# Patient Record
Sex: Male | Born: 1995 | Race: Black or African American | Hispanic: No | Marital: Single | Smoking: Current every day smoker
Health system: Southern US, Community
[De-identification: ages and names within clinical notes are randomized; demographics above are authoritative.]

## PROBLEM LIST (undated history)

## (undated) DIAGNOSIS — K311 Adult hypertrophic pyloric stenosis: Secondary | ICD-10-CM

## (undated) HISTORY — DX: Adult hypertrophic pyloric stenosis: K31.1

## (undated) HISTORY — PX: SMALL INTESTINE SURGERY: SHX150

## (undated) HISTORY — PX: KNEE SURGERY: SHX244

---
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
## 2013-01-06 ENCOUNTER — Telehealth: Payer: Self-pay | Admitting: Family Medicine

## 2013-01-06 NOTE — Telephone Encounter (Signed)
Attempted to call mother to inform that pt is in need of multiple shots per [facility] record. Record left up front for pick up. Wyatt Haste, RN-BSN

## 2013-01-06 NOTE — Telephone Encounter (Signed)
Mother is requesting a copy of Jose Gray's shot records left up front. JW

## 2013-01-08 ENCOUNTER — Telehealth: Payer: Self-pay | Admitting: Family Medicine

## 2013-01-08 NOTE — Telephone Encounter (Signed)
Pt's mother dropped off paperwork to be filled out concerning school.

## 2013-01-08 NOTE — Telephone Encounter (Signed)
I do not see where we have ever done a WCC on Louie.  He will need an [facility] appointment before forms for college can be completed.  Forms given back to [place].  She will call mom and schedule Akai an appointment.  Ileana Ladd

## 2013-02-21 ENCOUNTER — Ambulatory Visit (INDEPENDENT_AMBULATORY_CARE_PROVIDER_SITE_OTHER): Payer: Medicaid Other | Admitting: Family Medicine

## 2013-02-21 ENCOUNTER — Encounter: Payer: Self-pay | Admitting: Family Medicine

## 2013-02-21 VITALS — BP 139/74 | HR 56 | Temp 98.2°F | Ht 70.5 in | Wt 194.0 lb

## 2013-02-21 DIAGNOSIS — Z0000 Encounter for general adult medical examination without abnormal findings: Secondary | ICD-10-CM | POA: Insufficient documentation

## 2013-02-21 DIAGNOSIS — Z23 Encounter for immunization: Secondary | ICD-10-CM

## 2013-02-21 DIAGNOSIS — Z00129 Encounter for routine child health examination without abnormal findings: Secondary | ICD-10-CM

## 2013-02-21 DIAGNOSIS — Z7251 High risk heterosexual behavior: Secondary | ICD-10-CM | POA: Insufficient documentation

## 2013-02-21 NOTE — Assessment & Plan Note (Signed)
Patient reports approximately 15 lifetime partners. He does report condom use with each sexual encounter. He is currently only sexually active with females. Patient reports that he has upcoming appointment with on-campus clinic for STD check.

## 2013-02-21 NOTE — Assessment & Plan Note (Addendum)
17 year old male presents for annual physical exam. Immunizations were provided the patient is currently up-to-date. He does not qualify for any cancer screening as he is 17 years of age. He is an appropriate weight. He exercises regularly. Patient was counseled to return in one year for annual visit. Blood pressure is mildly elevated today and will need recheck at future office visits.

## 2013-02-21 NOTE — Patient Instructions (Signed)
Return of office in 1 year for yearly physical/well check.  Please keep your appointment with the clinic at your school.  If you ever would like a prescription to help you quit smoking please let up know.

## 2013-02-21 NOTE — Progress Notes (Addendum)
  Subjective:    Patient ID: Jose Gray, male    DOB: [birth year], 17 y.o.   MRN: [id number]  HPI 17 year old male presents for annual well visit.  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, states that classes are going well  Exercise-patient currently lives 4-5 times per week, does weights regularly, is planning to go for the football team 67  Diet-patient eats a well balanced diet  Sexual health-patient is sexually active with women, he has had approximately 15 sexual partners in his lifetime,he reports condom use with each episode of intercourse, he has an appointment scheduled with his [facility] clinic for a STD check next week, he does not report any recent penile pain or discharge, denies dysuria, denies fevers,  No chills, no abdominal pain  Tobacco-patient states that he smokes proximally to 3 black and mild per day, he is done this for the past year  Drug use-patient denies illicit drug use  Alcohol-patient denies alcohol use   Patient is without acute complaint today.  Reviewed past medical/social/family history.  Review of Systems  Constitutional: Negative for fever, chills and fatigue.  HENT: Negative for sore throat.   Respiratory: Negative for apnea, cough and choking.   Cardiovascular: Negative for chest pain.  Gastrointestinal: Negative for nausea, vomiting and diarrhea.  Genitourinary: Negative for dysuria and genital sores.       Objective:   Physical Exam Vitals: Reviewed General: Pleasant African American male, no acute distress HEENT: Normocephalic, pupils are equal round and reactive to light, extraocular movements are intact, no scleral icterus, nasal septum midline, moist mucous membranes, uvula midline, no pharyngeal erythema or exudate noted, neck was supple, no anterior  posterior cervical lymphadenopathy, bilateral TMs are pearly-gray without evidence of erythema or bulging, no thyromegaly Cardiac: Regular rate and rhythm, S1 and S2 present, no murmurs, no heaves or thrills Respiratory: Good auscultation bilaterally, normal effort Abdomen: Soft, nontender, scar in the epigastrium from previous pyloric stenosis surgery, no rebound, no guarding Extremities: No edema, 2+ radial pulses Neuro: Alert and oriented x3, cranial nerves II through XII are intact, strength is 5 out of 5 in all extremities, normal gait Skin: Multiple tattoos present over upper extremities as well as trunk       Assessment & Plan:  Please see problem specific assessment and plan.

## 2013-04-03 ENCOUNTER — Ambulatory Visit: Payer: Medicaid Other

## 2013-04-04 ENCOUNTER — Ambulatory Visit (INDEPENDENT_AMBULATORY_CARE_PROVIDER_SITE_OTHER): Payer: Medicaid Other | Admitting: *Deleted

## 2013-04-04 DIAGNOSIS — Z23 Encounter for immunization: Secondary | ICD-10-CM

## 2013-04-04 NOTE — Progress Notes (Signed)
Patient here today to receive 2nd MMR for college.  Declined Hep A and HPV vaccines.  Gaylene Brooks, RN

## 2013-05-10 IMAGING — CR DG KNEE COMPLETE 4+V*R*
4 series · 4 of 4 positions shown · non-contrast
Comparison: None.

CLINICAL DATA: Fell and injured right knee 1 week ago, persistent
suprapatellar pain.

RIGHT KNEE - COMPLETE 4+ VIEW 12/15/2010:

[view not recorded (1 of 4)]
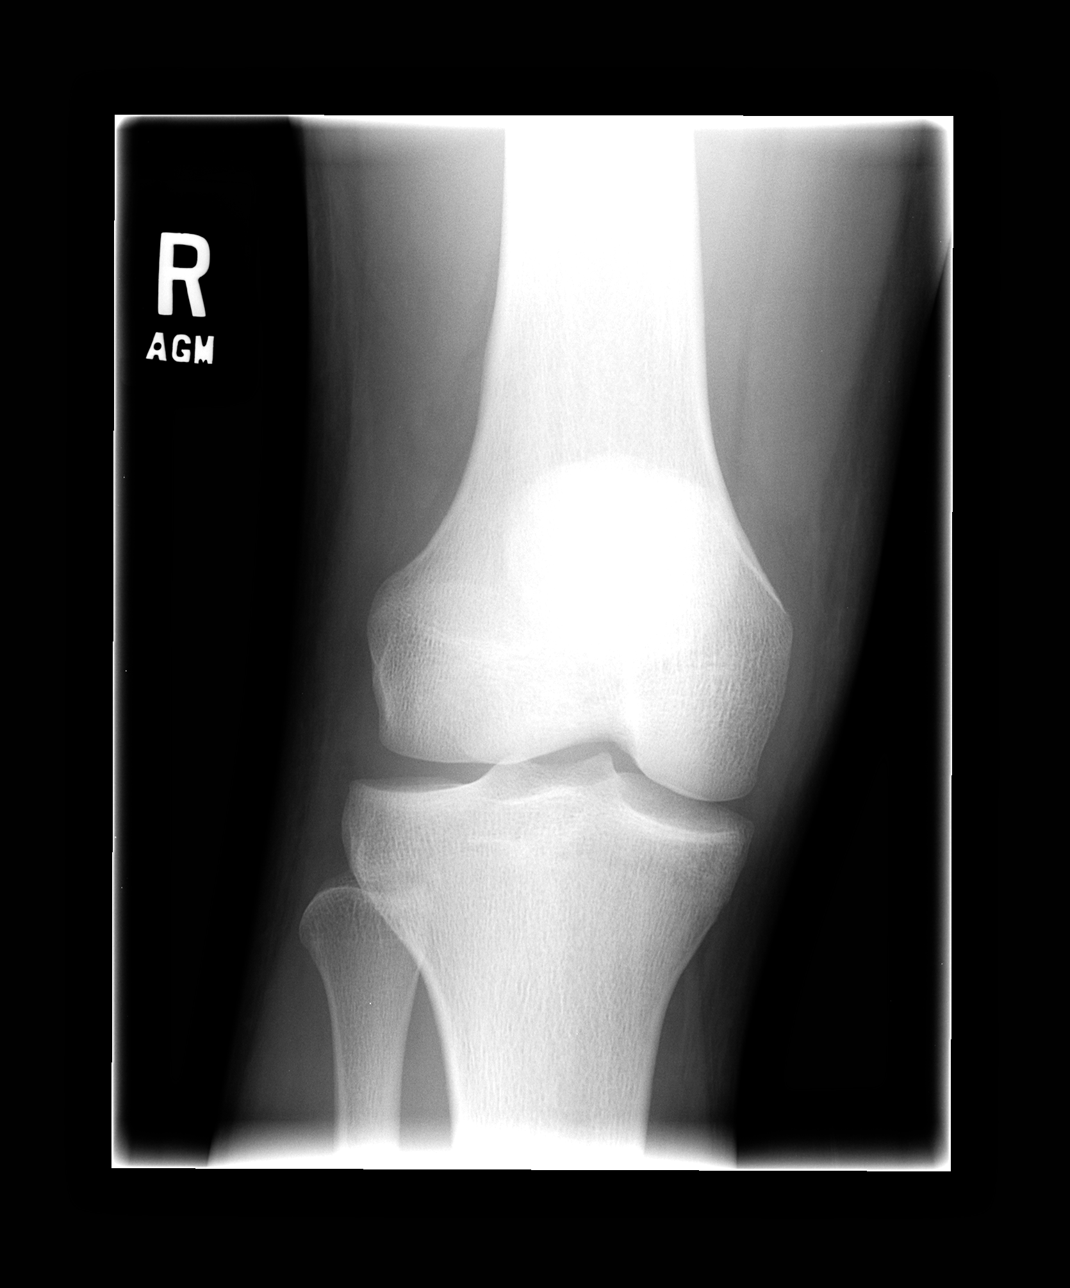

[view not recorded (2 of 4)]
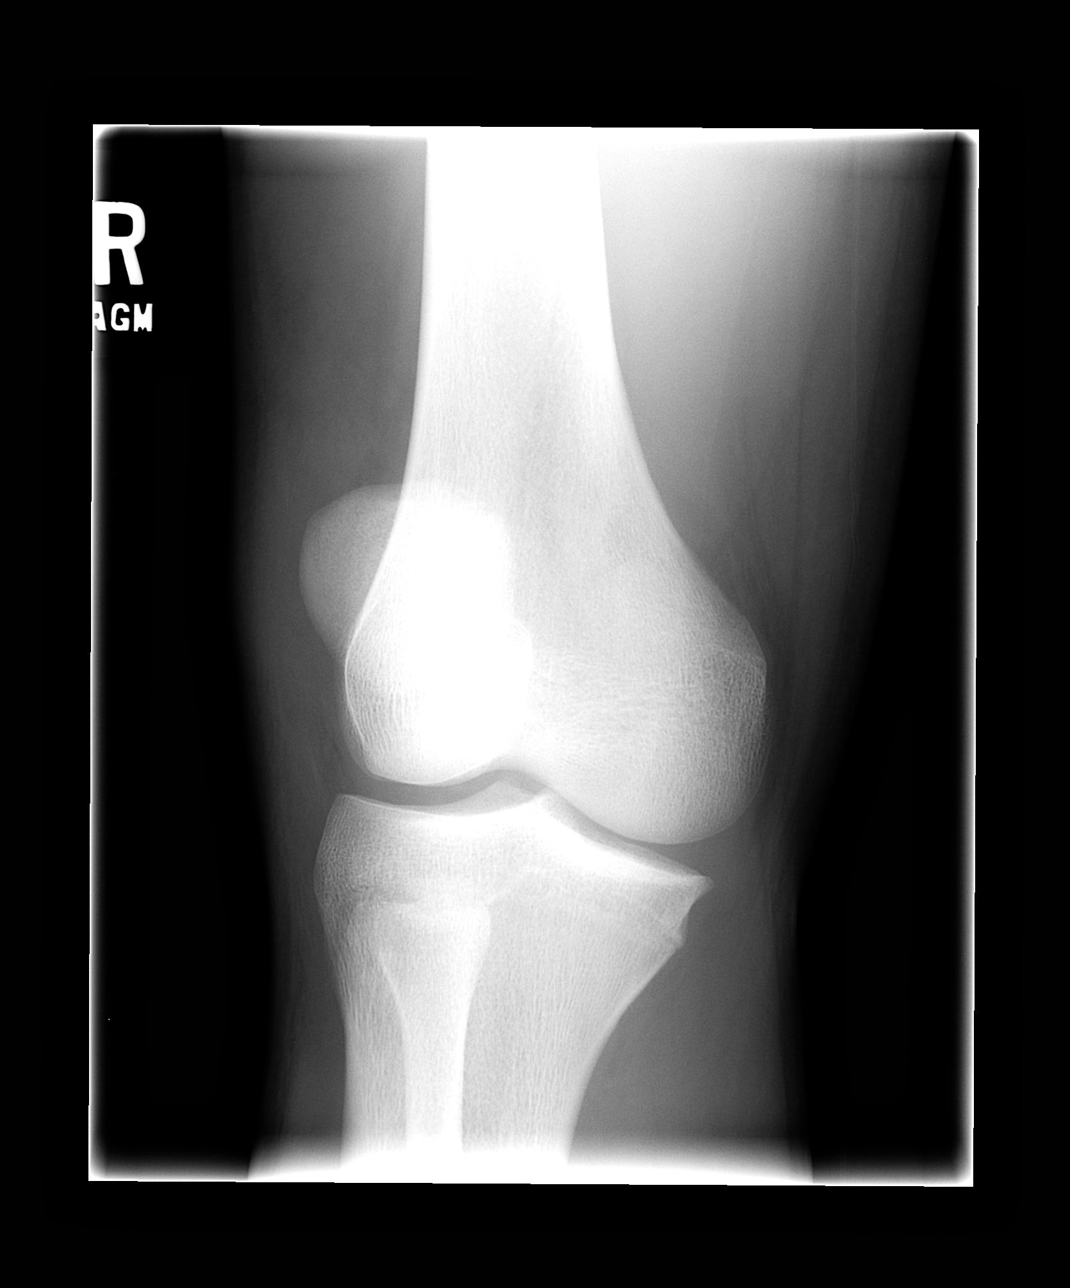

[view not recorded (3 of 4)]
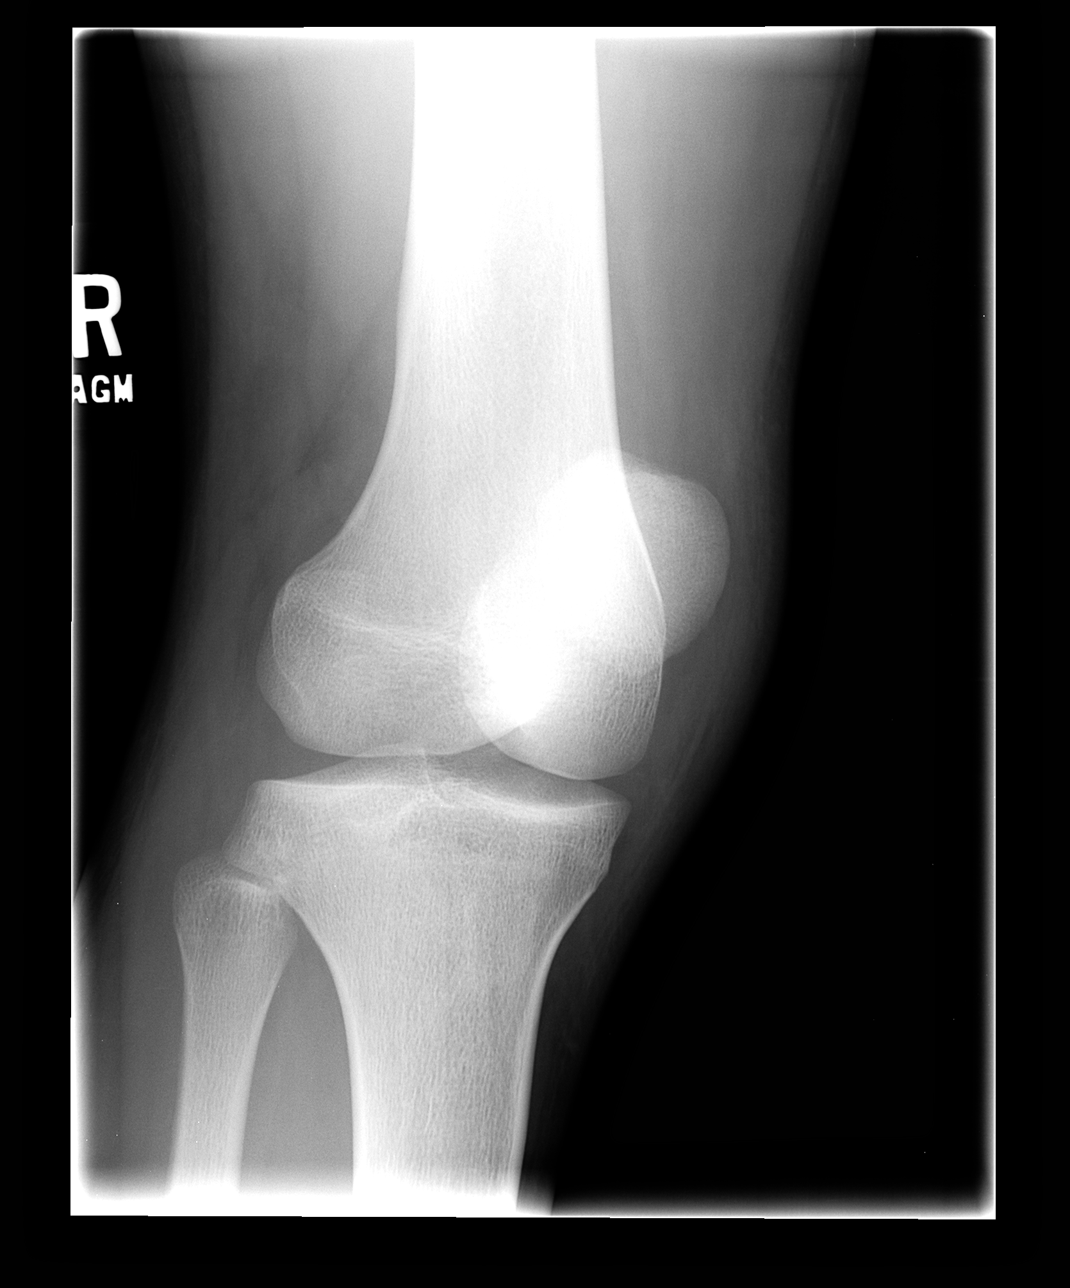

[view not recorded (4 of 4)]
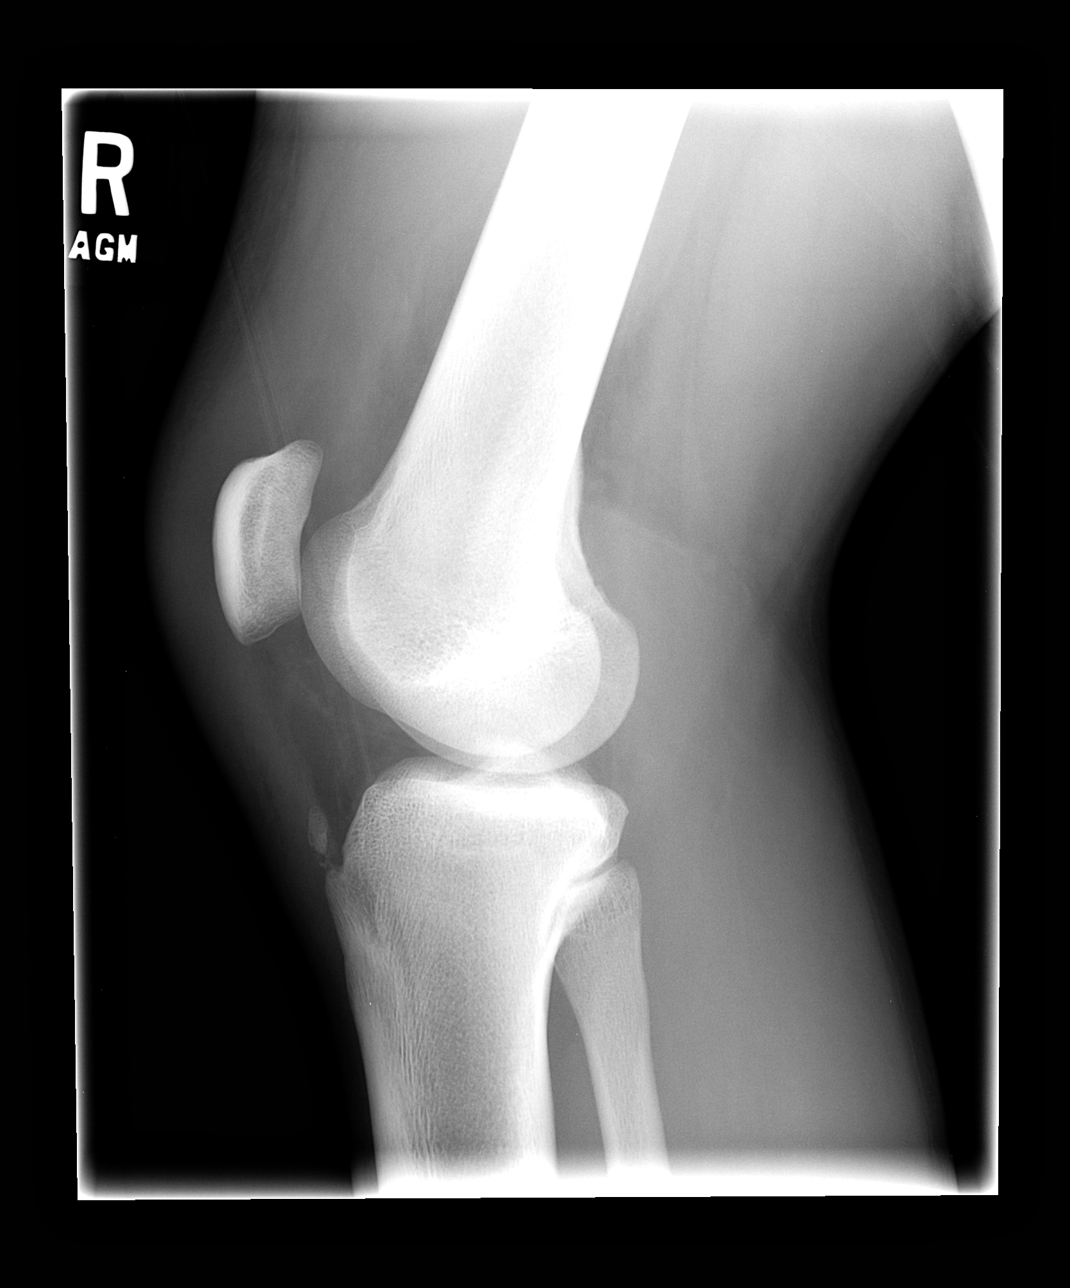

[4 of 4 positions shown; findings below may reference images not displayed]

FINDINGS: No evidence of acute, subacute, or healed fractures.
Joint spaces well preserved.  Bone mineral density well preserved.
Accessory ossicle adjacent to the tibial tubercle.  Prepatellar
soft tissue swelling.  No visible joint effusion.
IMPRESSION: Prepatellar soft tissue swelling.  No acute or significant osseous
abnormality.

## 2013-05-27 ENCOUNTER — Encounter: Payer: Self-pay | Admitting: Family Medicine

## 2013-05-27 ENCOUNTER — Other Ambulatory Visit (HOSPITAL_COMMUNITY)
Admission: RE | Admit: 2013-05-27 | Discharge: 2013-05-27 | Disposition: A | Discharge status: Home / Self Care | Payer: Medicaid Other | Source: Ambulatory Visit | Attending: Family Medicine | Admitting: Family Medicine

## 2013-05-27 ENCOUNTER — Ambulatory Visit (INDEPENDENT_AMBULATORY_CARE_PROVIDER_SITE_OTHER): Payer: Medicaid Other | Admitting: Family Medicine

## 2013-05-27 VITALS — BP 126/82 | HR 55 | Temp 98.2°F | Ht 70.5 in | Wt 199.0 lb

## 2013-05-27 DIAGNOSIS — Z113 Encounter for screening for infections with a predominantly sexual mode of transmission: Secondary | ICD-10-CM | POA: Insufficient documentation

## 2013-05-27 DIAGNOSIS — Z7251 High risk heterosexual behavior: Secondary | ICD-10-CM

## 2013-05-27 DIAGNOSIS — Z202 Contact with and (suspected) exposure to infections with a predominantly sexual mode of transmission: Secondary | ICD-10-CM

## 2013-05-27 DIAGNOSIS — Z9189 Other specified personal risk factors, not elsewhere classified: Secondary | ICD-10-CM

## 2013-05-27 DIAGNOSIS — F17200 Nicotine dependence, unspecified, uncomplicated: Secondary | ICD-10-CM

## 2013-05-27 DIAGNOSIS — Z720 Tobacco use: Secondary | ICD-10-CM | POA: Insufficient documentation

## 2013-05-27 LAB — HIV ANTIBODY (ROUTINE TESTING W REFLEX): HIV: NONREACTIVE

## 2013-05-27 LAB — RPR

## 2013-05-27 LAB — HEPATITIS PANEL, ACUTE: Hep B C IgM: NONREACTIVE

## 2013-05-27 NOTE — Progress Notes (Signed)
Patient ID: Jose Gray, male   DOB: 1995/06/25, 17 y.o.   MRN: [id number]  HPI:  Smoking cessation: currently smokes about two black and milds per day. Would like to quit. He has stopped for up to a week at a time. Does not have physical cravings or withdrawal symptoms. He smokes whenever he is bored or has nothing to do. He denies alcohol or drug use. Wants to know best ways for quitting.   STD testing: Requests STD testing today. Has been sexually active with 5 male partners within the last year. Was treated for positive chlamydia test in June; that partner was also treated. He does not use protection. Denies any penile discharge, dysuria, genital sores, or fevers.   ROS: See HPI  PMFSH: treated for chlamydia in June  PHYSICAL EXAM: BP 138/87  Pulse 55  Temp(Src) 98.2 F (36.8 C) (Oral)  Ht 5' 10.5" (1.791 m)  Wt 199 lb (90.266 kg)  BMI 28.14 kg/m2 Gen: NAD HEENT: NCAT Heart: RRR Lungs: CTAB Abdomen: soft, NTTP, muscular Neuro: grossly nonfocal, speech intact  ASSESSMENT/PLAN:  See problem based charting for assessment/plan.   FOLLOW UP: F/u in 6 months for repeat STD screening.  [place] J. Pollie Meyer, MD [facility]

## 2013-05-27 NOTE — Patient Instructions (Addendum)
It was nice to see you today!  See the handout below about safe sex. I will call you if your test results are not normal.  Otherwise, I will send you a letter.  If you do not hear from me with in 2 weeks please call our office.      See the phone number attached about quitting smoking.  Return in 6 months for repeat STD testing, or sooner if you have any problems.  Be well, Dr. Randye Lobo Sex Safe sex is about reducing the risk of giving or getting a sexually transmitted disease (STD). STDs are spread through sexual contact involving the genitals, mouth, or rectum. Some STDS can be cured and others cannot. Safe sex can also prevent unintended pregnancies.  SAFE SEX PRACTICES  Limit your sexual activity to only one partner who is only having sex with you.  Talk to your partner about their past partners, past STDs, and drug use.  Use a condom every time you have sexual intercourse. This includes vaginal, oral, and anal sexual activity. Both females and males should wear condoms during oral sex. Only use latex or polyurethane condoms and water-based lubricants. Petroleum-based lubricants or oils used to lubricate a condom will weaken the condom and increase the chance that it will break. The condom should be in place from the beginning to the end of sexual activity. Wearing a condom reduces, but does not completely eliminate, your risk of getting or giving a STD. STDs can be spread by contact with skin of surrounding areas.  Get vaccinated for hepatitis B and HPV.  Avoid alcohol and recreational drugs which can affect your judgement. You may forget to use a condom or participate in high-risk sex.  For females, avoid douching after sexual intercourse. Douching can spread an infection farther into the reproductive tract.  Check your body for signs of sores, blisters, rashes, or unusual discharge. See your caregiver if you notice any of these signs.  Avoid sexual contact if you have  symptoms of an infection or are being treated for an STD. If you or your partner has herpes, avoid sexual contact when blisters are present. Use condoms at all other times.  See your caregiver for regular screenings, examinations, and tests for STDs. Before having sex with a new partner, each of you should be screened for STDs and talk about the results with your partner. BENEFITS OF SAFE SEX   There is less of a chance of getting or giving an STD.  You can prevent unwanted or unintended pregnancies.  By discussing safer sex concerns with your partner, you may increase feelings of intimacy, comfort, trust, and honesty between the both of you. Document Released: 06/22/2004 Document Revised: 02/07/2012 Document Reviewed: 11/06/2011 [facility] Patient Information 2014 [place], [place].

## 2013-05-27 NOTE — Assessment & Plan Note (Signed)
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 Discussed this with patient and gave handout for 1800 quit now line to help him in quitting.

## 2013-05-27 NOTE — Assessment & Plan Note (Signed)
Check HIV, RPR, acute hepatitis panel, and urine gc/chlamydia today. Advised on the importance of condoms to prevent transmission of STD's. Commended patient for coming in for routine testing, and advised that he return in 6 months for repeat screening.

## 2013-05-28 ENCOUNTER — Telehealth: Payer: Self-pay | Admitting: Family Medicine

## 2013-05-28 NOTE — Telephone Encounter (Signed)
[facility] red team, please call pt to let him know his STD tests were all negative. Thanks! Latrelle Dodrill, MD

## 2013-05-28 NOTE — Telephone Encounter (Signed)
Pt notified.  Sevana Grandinetti L, CMA  

## 2013-06-05 ENCOUNTER — Encounter: Payer: Self-pay | Admitting: Family Medicine

## 2013-06-05 ENCOUNTER — Ambulatory Visit (INDEPENDENT_AMBULATORY_CARE_PROVIDER_SITE_OTHER): Payer: Medicaid Other | Admitting: Family Medicine

## 2013-06-05 VITALS — BP 134/84 | HR 98 | Temp 98.7°F | Wt 199.0 lb

## 2013-06-05 DIAGNOSIS — J029 Acute pharyngitis, unspecified: Secondary | ICD-10-CM

## 2013-06-05 DIAGNOSIS — J028 Acute pharyngitis due to other specified organisms: Secondary | ICD-10-CM

## 2013-06-05 DIAGNOSIS — J020 Streptococcal pharyngitis: Secondary | ICD-10-CM

## 2013-06-05 DIAGNOSIS — B9789 Other viral agents as the cause of diseases classified elsewhere: Secondary | ICD-10-CM

## 2013-06-05 LAB — POCT RAPID STREP A (OFFICE): RAPID STREP A SCREEN: NEGATIVE

## 2013-06-05 NOTE — Assessment & Plan Note (Addendum)
Negative strep.  Symptomatic treatment Likely viral based on exam and history. Improving. See insttructions -- FU precautions provided

## 2013-06-05 NOTE — Patient Instructions (Addendum)
Your strep test was negative.  I think you have a viral sore throat.    The best treatments for this are gargling with salt water and over the counter Chloraseptic spray.    If you start having fevers, chills, or other concerns, please return or call.    It was good to see you today  Sore Throat A sore throat is pain, burning, irritation, or scratchiness of the throat. There is often pain or tenderness when swallowing or talking. A sore throat may be accompanied by other symptoms, such as coughing, sneezing, fever, and swollen neck glands. A sore throat is often the first sign of another sickness, such as a cold, flu, strep throat, or mononucleosis (commonly known as mono). Most sore throats go away without medical treatment. CAUSES  The most common causes of a sore throat include:  A viral infection, such as a cold, flu, or mono.  A bacterial infection, such as strep throat, tonsillitis, or whooping cough.  Seasonal allergies.  Dryness in the air.  Irritants, such as smoke or pollution.  Gastroesophageal reflux disease (GERD). HOME CARE INSTRUCTIONS   Only take over-the-counter medicines as directed by your caregiver.  Drink enough fluids to keep your urine clear or pale yellow.  Rest as needed.  Try using throat sprays, lozenges, or sucking on hard candy to ease any pain (if older than 4 years or as directed).  Sip warm liquids, such as broth, herbal tea, or warm water with honey to relieve pain temporarily. You may also eat or drink cold or frozen liquids such as frozen ice pops.  Gargle with salt water (mix 1 tsp salt with 8 oz of water).  Do not smoke and avoid secondhand smoke.  Put a cool-mist humidifier in your bedroom at night to moisten the air. You can also turn on a hot shower and sit in the bathroom with the door closed for 5 10 minutes. SEEK IMMEDIATE MEDICAL CARE IF:  You have difficulty breathing.  You are unable to swallow fluids, soft foods, or your  saliva.  You have increased swelling in the throat.  Your sore throat does not get better in 7 days.  You have nausea and vomiting.  You have a fever or persistent symptoms for more than 2 3 days.  You have a fever and your symptoms suddenly get worse. MAKE SURE YOU:   Understand these instructions.  Will watch your condition.  Will get help right away if you are not doing well or get worse. Document Released: 06/22/2004 Document Revised: 05/01/2012 Document Reviewed: 01/21/2012 [facility] Patient Information 2014 [place].

## 2013-06-05 NOTE — Progress Notes (Signed)
  Subjective:     Jose Gray is a 18 y.o. male who presents for evaluation of sore throat. Associated symptoms include nasal blockage, post nasal drip, sinus and nasal congestion and sore throat. Onset of symptoms was 3 day ago, and have been improved since that time. He is drinking plenty of fluids. He has not had a recent close exposure to someone with proven streptococcal pharyngitis.  The following portions of the patient's history were reviewed and updated as appropriate: allergies, current medications, past family history, past medical history, past social history, past surgical history and problem list.  Review of Systems Pertinent items are noted in HPI.    Objective:    BP 134/84  Pulse 98  Temp(Src) 98.7 F (37.1 C) (Oral)  Wt 199 lb (90.266 kg) General:  alert, cooperative, appears stated age and no distress  Mouth:  lips, mucosa, and tongue normal; teeth and gums normal and +3 tonsils BL with some exudates noted.  He produces a picture of his throat from yesterday with pronounced exudates.    Neck: supple, symmetrical, trachea midline, thyroid not enlarged, symmetric, no tenderness/mass/nodules and minimal adenopathy.   Laboratory Strep test done and negative

## 2013-11-12 ENCOUNTER — Telehealth: Payer: Self-pay | Admitting: Family Medicine

## 2013-11-12 ENCOUNTER — Ambulatory Visit (HOSPITAL_COMMUNITY)
Admission: RE | Admit: 2013-11-12 | Discharge: 2013-11-12 | Disposition: A | Discharge status: Home / Self Care | Payer: Self-pay | Source: Ambulatory Visit | Attending: Family Medicine | Admitting: Family Medicine

## 2013-11-12 ENCOUNTER — Encounter: Payer: Self-pay | Admitting: Family Medicine

## 2013-11-12 ENCOUNTER — Ambulatory Visit (INDEPENDENT_AMBULATORY_CARE_PROVIDER_SITE_OTHER): Payer: Self-pay | Admitting: Family Medicine

## 2013-11-12 VITALS — BP 137/81 | HR 83 | Temp 99.1°F | Ht 70.0 in | Wt 185.0 lb

## 2013-11-12 DIAGNOSIS — M25529 Pain in unspecified elbow: Secondary | ICD-10-CM | POA: Insufficient documentation

## 2013-11-12 DIAGNOSIS — M25522 Pain in left elbow: Secondary | ICD-10-CM

## 2013-11-12 MED ORDER — IBUPROFEN 600 MG PO TABS: 600.0000 mg | ORAL_TABLET | Freq: Four times a day (QID) | ORAL | Status: DC | PRN

## 2013-11-12 NOTE — Progress Notes (Signed)
Patient ID: Bradly Chrisric Marquise Saari    DOB: 11-16-1995, 18 y.o.   MRN: [id number] --- Subjective:  Minerva Areolaric is a 18 y.o.male right handed male who presents with left elbow pain. Patient was going up a hill on Monday and slipped on his left side and fell onto his hyperextended left arm. Immediately after injury he noticed significant swelling and pain in the elbow joint. Swelling improved with icing. Pain has been constant, not improved with ibuprofen. He has not been able to extend or flex his elbow without significant pain. He is able to supinate and pronate with pain. Pain is present both with movement and with rest. He denies any previous surgery or injury to that arm. He denies any pain in his wrist or shoulder.  ROS: see HPI Past Medical History: reviewed and updated medications and allergies. Social History: Tobacco: Current every day smoker  Objective: Filed Vitals:   11/12/13 0955  BP: 137/81  Pulse: 83  Temp: 99.1 F (37.3 C)    Physical Examination:   General appearance - alert, well appearing, and in no distress Left elbow-mild soft tissue swelling around elbow joint. Tenderness to palpation along olecranon as well as along radial head. Able to flex elbow at 9800 but no further. Limited extension. Normal wrist range of motion and no point tenderness Radial pulses normal

## 2013-11-12 NOTE — Assessment & Plan Note (Signed)
Given history of hyperextension injury and tenderness along olecranon, concern for olecranon fracture or radial head fracture. -[facility] and treatment

## 2013-11-12 NOTE — Patient Instructions (Signed)
We are going to get you seen by orthopedics.

## 2013-11-12 NOTE — Telephone Encounter (Signed)
Called patient to let him know of xray results.  No evidence of fracture or dislocation on xray. Reviewed case with Dr. Jennette KettleNeal who recommends icing area and doing flexion and extension exercises 2-3 times per day. Patient can obtain sling at pharmacy for comfort if he would like.  Will send ibuprofen 600mg  to take every 6hrs as needed for pain with food.  Follow up in 1 week at the clinic to assess range of motion and pain control.   Marena ChancyStephanie Losq, PGY-3 Family Medicine Resident

## 2013-11-12 NOTE — Addendum Note (Signed)
Addended by: Farrell OursEVANS, SARA K on: 11/12/2013 02:23 PM   Modules accepted: Orders

## 2017-01-29 ENCOUNTER — Encounter (HOSPITAL_COMMUNITY): Payer: Self-pay | Admitting: *Deleted

## 2017-01-29 ENCOUNTER — Emergency Department (HOSPITAL_COMMUNITY)
Admission: EM | Admit: 2017-01-29 | Discharge: 2017-01-29 | Disposition: A | Discharge status: Home / Self Care | Payer: Self-pay | Attending: Emergency Medicine | Admitting: Emergency Medicine

## 2017-01-29 DIAGNOSIS — K029 Dental caries, unspecified: Secondary | ICD-10-CM | POA: Insufficient documentation

## 2017-01-29 DIAGNOSIS — F17210 Nicotine dependence, cigarettes, uncomplicated: Secondary | ICD-10-CM | POA: Insufficient documentation

## 2017-01-29 MED ORDER — HYDROCODONE-ACETAMINOPHEN 5-325 MG PO TABS
1.0000 | ORAL_TABLET | Freq: Once | ORAL | Status: AC
  Administered 2017-01-29: 1 via ORAL
  Filled 2017-01-29: qty 1

## 2017-01-29 MED ORDER — PENICILLIN V POTASSIUM 500 MG PO TABS: 500.0000 mg | ORAL_TABLET | Freq: Four times a day (QID) | ORAL | 0 refills | Status: AC

## 2017-01-29 NOTE — ED Notes (Signed)
Patient Alert and oriented X4. Stable and ambulatory. Patient verbalized understanding of the discharge instructions.  Patient belongings were taken by the patient. Patient stated he is not driving.

## 2017-01-29 NOTE — Discharge Instructions (Signed)
Please read instructions below. °Take the antibiotic, Penicillin V, 4 times per day until they are gone. °You can take Naproxen/aleve up to 2 times per day with meals, as needed for pain. °Schedule an appointment with a dentist, using the dental resource guide attached. °Return to the ER for difficulty swallowing or breathing, fever, or new or worsening symptoms. ° °

## 2017-01-29 NOTE — ED Provider Notes (Signed)
[facility]-EMERGENCY DEPT Provider Note   CSN: [id number] Arrival date & time: 01/29/17  1457     History   Chief Complaint Chief Complaint  Patient presents with  . Dental Pain    HPI Jose Gray is a 21 y.o. male presenting to the ED complaining of gradually worsening dental pain located left last lower molar. Pt states pain began 1 month ago, described as intermittent throbbing and aching. Has taken tylenol and used orajel that initially provided him with relief of symptoms, however has been less effective that past week. Denies difficulty swallowing or breathing, fever, chills, N/V, or other symptoms today.  The history is provided by the patient.    Past Medical History:  Diagnosis Date  . PS (pyloric stenosis)    at 105 months of age- had surgery to correct    Patient Active Problem List   Diagnosis Date Noted  . Left elbow pain 11/12/2013  . Sore throat (viral) 06/05/2013  . Tobacco abuse 05/27/2013  . Physical exam, annual 02/21/2013  . High risk sexual behavior 02/21/2013  . Rotator cuff tear 02/23/2012  . Arm pain, musculoskeletal 02/22/2012  . Abscess of left axilla 02/05/2012  . Knee sprain and strain 10/06/2011  . Seasonal allergies 09/05/2011  . Sexually transmitted disease exposure 01/11/2011  . Septic arthritis of knee, right (HCC) 12/29/2010  . HIP PAIN 10/05/2009  . 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 KNEE SURGERY     joint infection from superficial skin wound- cleaned out by ortho  . SMALL INTESTINE SURGERY     unsure exactly what type of surgery from mother's description sounds like pyloric stenosis surgcial correction       Home Medications    Prior to Admission medications   Medication Sig Start Date End Date Taking? Authorizing Provider  ibuprofen (ADVIL,MOTRIN) 600 MG tablet Take 1 tablet (600 mg total) by mouth every 6 (six) hours as needed. Take with food 11/12/13   Losq,  Leafy Kindle, MD  penicillin v potassium (VEETID) 500 MG tablet Take 1 tablet (500 mg total) by mouth 4 (four) times daily. 01/29/17 02/05/17  Russo, [place] N, PA-C    Family History Family History  Problem Relation Age of Onset  . Cancer Maternal Grandmother   . Hypertension Maternal Grandmother     Social History Social History  Substance Use Topics  . Smoking status: Current Every Day Smoker    Packs/day: 0.50    Types: Cigarettes, Cigars  . Smokeless tobacco: Never Used  . Alcohol use No     Allergies   Pollen extract   Review of Systems Review of Systems  Constitutional: Negative for chills and fever.  HENT: Positive for dental problem. Negative for facial swelling, trouble swallowing and voice change.   Respiratory: Negative for shortness of breath and stridor.      Physical Exam Updated Vital Signs BP (!) 149/97 (BP Location: Right Arm)   Pulse 60   Temp 98.1 F (36.7 C) (Oral)   Resp 14   Ht 5\' 10"  (1.778 m)   Wt 81.6 kg (180 lb)   SpO2 100%   BMI 25.83 kg/m   Physical Exam  Constitutional: He appears well-developed and well-nourished. No distress.  HENT:  Head: Normocephalic and atraumatic.  Mouth/Throat: Uvula is midline, oropharynx is clear and moist and mucous membranes are normal. No trismus in the jaw. Dental caries present. No dental abscesses or uvula swelling.  Tooth number 18 with large dental carie on lingual aspect of tooth. No gingival erythema or edema, no fluctuant abscess. Tolerating secretions.  Eyes: Conjunctivae are normal.  Neck: Normal range of motion. Neck supple. No tracheal deviation present.  Cardiovascular: Normal rate, regular rhythm and normal heart sounds.   Pulmonary/Chest: Effort normal and breath sounds normal. No stridor. No respiratory distress.  Lymphadenopathy:    He has no cervical adenopathy.  Psychiatric: He has a normal mood and affect. His behavior is normal.  Nursing note and vitals reviewed.    ED  Treatments / Results  Labs (all labs ordered are listed, but only abnormal results are displayed) Labs Reviewed - No data to display  EKG  EKG Interpretation None       Radiology No results found.  Procedures Procedures (including critical care time)  Medications Ordered in ED Medications  HYDROcodone-acetaminophen (NORCO/VICODIN) 5-325 MG per tablet 1 tablet (not administered)     Initial Impression / Assessment and Plan / ED Course  I have reviewed the triage vital signs and the nursing notes.  Pertinent labs & imaging results that were available during my care of the patient were reviewed by me and considered in my medical decision making (see chart for details).     Patient with dental caries.  No gross abscess.  VSS, afebrile, tolerating secretions. Exam unconcerning for Ludwig's angina or spread of infection.  Will treat with penicillin and pain medicine.  Urged patient to follow-up with dentist. Pt safe for discharge.  Discussed results, findings, treatment and follow up. Patient advised of return precautions. Patient verbalized understanding and agreed with plan.   Final Clinical Impressions(s) / ED Diagnoses   Final diagnoses:  Pain due to dental caries    New Prescriptions New Prescriptions   PENICILLIN V POTASSIUM (VEETID) 500 MG TABLET    Take 1 tablet (500 mg total) by mouth 4 (four) times daily.     Russo, [place] N, PA-C 01/29/17 1724    Margarita Grizzleay, Danielle, MD 01/29/17 [phone]

## 2017-01-29 NOTE — ED Notes (Signed)
Pt called x 2 with no answer  

## 2017-07-26 ENCOUNTER — Encounter (HOSPITAL_COMMUNITY): Payer: Self-pay

## 2017-07-26 ENCOUNTER — Other Ambulatory Visit: Payer: Self-pay

## 2017-07-26 ENCOUNTER — Emergency Department (HOSPITAL_COMMUNITY)
Admission: EM | Admit: 2017-07-26 | Discharge: 2017-07-26 | Disposition: A | Discharge status: Home / Self Care | Payer: Self-pay | Attending: Emergency Medicine | Admitting: Emergency Medicine

## 2017-07-26 DIAGNOSIS — Z202 Contact with and (suspected) exposure to infections with a predominantly sexual mode of transmission: Secondary | ICD-10-CM | POA: Insufficient documentation

## 2017-07-26 LAB — URINALYSIS, ROUTINE W REFLEX MICROSCOPIC
Bilirubin Urine: NEGATIVE
Glucose, UA: NEGATIVE mg/dL
Hgb urine dipstick: NEGATIVE
Ketones, ur: NEGATIVE mg/dL
LEUKOCYTES UA: NEGATIVE
NITRITE: NEGATIVE
Protein, ur: NEGATIVE mg/dL
SPECIFIC GRAVITY, URINE: 1.027 (ref 1.005–1.030)
pH: 5 (ref 5.0–8.0)

## 2017-07-26 NOTE — Discharge Instructions (Signed)
Please read attached information regarding your condition. We will contact you with the results of your remaining lab work when available. You can follow-up at the [facility] for any future STD testings. Return to ED for worsening symptoms, trouble with urination, severe abdominal pain with fevers.

## 2017-07-26 NOTE — ED Notes (Signed)
Pt denies any pain or discharge but states he is concerned because he was having relations with someone who was having discharge

## 2017-07-26 NOTE — ED Provider Notes (Signed)
[facility]   CSN: [id number] Arrival date & time: 07/26/17  1032     History   Chief Complaint Chief Complaint  Patient presents with  . Exposure to STD    HPI Jose Gray is a 22 y.o. male who presents to ED for evaluation of STD check. He had unprotected sexual intercourse with a male partner approximately 5 days ago.  This partner complained of vaginal discharge and will discharge for STDs but was told that it would take 10-14 days for any results.  He is unsure if she was diagnosed with gonorrhea, chlamydia or other STD.  He denies any symptoms at this time including penile discharge, rashes or lesions, abdominal pain, fevers, dysuria.  He states that he did have a "breakout" rash several months ago but denies any pain or burning sensation with this.  HPI  Past Medical History:  Diagnosis Date  . PS (pyloric stenosis)    at 563 months of age- had surgery to correct    Patient Active Problem List   Diagnosis Date Noted  . Left elbow pain 11/12/2013  . Sore throat (viral) 06/05/2013  . Tobacco abuse 05/27/2013  . Physical exam, annual 02/21/2013  . High risk sexual behavior 02/21/2013  . Rotator cuff tear 02/23/2012  . Arm pain, musculoskeletal 02/22/2012  . Abscess of left axilla 02/05/2012  . Knee sprain and strain 10/06/2011  . Seasonal allergies 09/05/2011  . Sexually transmitted disease exposure 01/11/2011  . Septic arthritis of knee, right (HCC) 12/29/2010  . HIP PAIN 10/05/2009  . 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 KNEE SURGERY     joint infection from superficial skin wound- cleaned out by ortho  . SMALL INTESTINE SURGERY     unsure exactly what type of surgery from mother's description sounds like pyloric stenosis surgcial correction       Home Medications    Prior to Admission medications   Medication Sig Start Date End  Date Taking? Authorizing Provider  ibuprofen (ADVIL,MOTRIN) 600 MG tablet Take 1 tablet (600 mg total) by mouth every 6 (six) hours as needed. Take with food Patient not taking: Reported on 07/26/2017 11/12/13   Lonia SkinnerLosq, Stephanie E, MD    Family History Family History  Problem Relation Age of Onset  . Cancer Maternal Grandmother   . Hypertension Maternal Grandmother     Social History Social History   Tobacco Use  . Smoking status: Current Every Day Smoker    Packs/day: 0.50    Types: Cigarettes, Cigars  . Smokeless tobacco: Never Used  Substance Use Topics  . Alcohol use: No  . Drug use: No     Allergies   Pollen extract   Review of Systems Review of Systems  Constitutional: Negative for chills and fever.  Gastrointestinal: Negative for abdominal pain.  Genitourinary: Negative for discharge, dysuria, genital sores, penile pain, penile swelling, scrotal swelling, testicular pain and urgency.  Skin: Negative for rash.     Physical Exam Updated Vital Signs BP 127/84 (BP Location: Right Arm)   Pulse 72   Temp 98 F (36.7 C) (Oral)   Resp 20   SpO2 100%   Physical Exam  Constitutional: He appears well-developed and well-nourished. No distress.  HENT:  Head: Normocephalic and atraumatic.  Eyes: Conjunctivae and EOM are normal. No scleral icterus.  Neck: Normal range of motion.  Pulmonary/Chest: Effort normal. No respiratory distress.  Genitourinary:  Penis normal. Right testis shows no tenderness. Left testis shows no tenderness. Circumcised. No penile erythema or penile tenderness. No discharge found.  Genitourinary Comments: Normal male genitalia noted. Penis, scrotum, and testicles without swelling, lesions, rashes, or tenderness present. No penile discharge noted. Cremasteric reflex intact. RN served as Biomedical engineer during the exam.   Neurological: He is alert.  Skin: No rash noted. He is not diaphoretic.  Psychiatric: He has a normal mood and affect.  Nursing note  and vitals reviewed.    ED Treatments / Results  Labs (all labs ordered are listed, but only abnormal results are displayed) Labs Reviewed  URINALYSIS, ROUTINE W REFLEX MICROSCOPIC  HIV ANTIBODY (ROUTINE TESTING)  RPR  GC/CHLAMYDIA PROBE AMP ([place]) NOT AT [facility]    EKG  EKG Interpretation None       Radiology No results found.  Procedures Procedures (including critical care time)  Medications Ordered in ED Medications - No data to display   Initial Impression / Assessment and Plan / ED Course  I have reviewed the triage vital signs and the nursing notes.  Pertinent labs & imaging results that were available during my care of the patient were reviewed by me and considered in my medical decision making (see chart for details).     Patient presents to ED for evaluation of possible STD exposure.  States that his male sexual partner was complaining of vaginal discharge.  He denies any symptoms at this time including dysuria, penile discharge, rashes.  Physical exam he is overall well-appearing.  GU examination is unremarkable.  Urinalysis shows no signs of infection.  I do not believe that there is a reason to empirically treat for gonorrhea and chlamydia based on patient's lack of history and physical exam findings or any known exposures, and patient agrees. I did encourage him to follow-up with the results of his remaining lab work when available.  Patient appears stable for discharge at this time.  Strict return precautions given.  Final Clinical Impressions(s) / ED Diagnoses   Final diagnoses:  Possible exposure to STD    ED Discharge Orders    None       Dietrich Pates, PA-C 07/26/17 1143    Derwood Kaplan, MD 07/28/17 2155

## 2017-07-26 NOTE — ED Triage Notes (Addendum)
Pt states a girl he was seeing has been having vaginal discharge. He is here to be checked for STD. Pt denies symptoms but wants to be checked. Pt reports he had a breakout but denies presently.

## 2017-07-27 LAB — RPR: RPR Ser Ql: NONREACTIVE

## 2017-07-28 LAB — HIV ANTIBODY (ROUTINE TESTING W REFLEX): HIV Screen 4th Generation wRfx: NONREACTIVE

## 2020-04-09 ENCOUNTER — Encounter (HOSPITAL_COMMUNITY): Payer: Self-pay | Admitting: *Deleted

## 2020-04-09 ENCOUNTER — Other Ambulatory Visit: Payer: Self-pay

## 2020-04-09 ENCOUNTER — Emergency Department (HOSPITAL_COMMUNITY)
Admission: EM | Admit: 2020-04-09 | Discharge: 2020-04-09 | Disposition: A | Discharge status: Home / Self Care | Payer: Self-pay | Attending: Emergency Medicine | Admitting: Emergency Medicine

## 2020-04-09 DIAGNOSIS — W57XXXA Bitten or stung by nonvenomous insect and other nonvenomous arthropods, initial encounter: Secondary | ICD-10-CM | POA: Insufficient documentation

## 2020-04-09 DIAGNOSIS — L02413 Cutaneous abscess of right upper limb: Secondary | ICD-10-CM | POA: Insufficient documentation

## 2020-04-09 DIAGNOSIS — F17290 Nicotine dependence, other tobacco product, uncomplicated: Secondary | ICD-10-CM | POA: Insufficient documentation

## 2020-04-09 DIAGNOSIS — L0291 Cutaneous abscess, unspecified: Secondary | ICD-10-CM

## 2020-04-09 DIAGNOSIS — F17210 Nicotine dependence, cigarettes, uncomplicated: Secondary | ICD-10-CM | POA: Insufficient documentation

## 2020-04-09 MED ORDER — LIDOCAINE HCL (PF) 1 % IJ SOLN
30.0000 mL | Freq: Once | INTRAMUSCULAR | Status: AC
  Administered 2020-04-09: 30 mL
  Filled 2020-04-09: qty 30

## 2020-04-09 MED ORDER — OXYCODONE-ACETAMINOPHEN 5-325 MG PO TABS
1.0000 | ORAL_TABLET | Freq: Once | ORAL | Status: AC
  Administered 2020-04-09: 1 via ORAL
  Filled 2020-04-09: qty 1

## 2020-04-09 MED ORDER — DOXYCYCLINE HYCLATE 100 MG PO CAPS: 100.0000 mg | ORAL_CAPSULE | Freq: Two times a day (BID) | ORAL | 0 refills | Status: DC

## 2020-04-09 MED ORDER — KETOROLAC TROMETHAMINE 60 MG/2ML IM SOLN
60.0000 mg | Freq: Once | INTRAMUSCULAR | Status: AC
  Administered 2020-04-09: 60 mg via INTRAMUSCULAR
  Filled 2020-04-09: qty 2

## 2020-04-09 NOTE — ED Triage Notes (Signed)
The pt is c/o a bug bite to his  Rt forearm 2 days ago  He did not see anything  He has been sticking a tweeser  Into that area  Red swollen  No know temp

## 2020-04-09 NOTE — ED Provider Notes (Signed)
[facility] EMERGENCY DEPARTMENT Provider Note   CSN: [id number] Arrival date & time: 04/09/20  1529     History Chief Complaint  Patient presents with  . Abscess    Jose Gray is a 24 y.o. male with pertinent past medical history of tobacco abuse that presents the emergency department today for bug bite.  Patient states that mosquito bit him  2 days ago, has been forming an abscess and now has been draining.  Patient states that he tried taking amoxicillin, has taken 1 so far today.  Denies any fevers, chills, nausea, vomiting.  Patient states he has pain surrounding that area, has not been able to move it normally.  Does not take anything for this, states that he is generally healthy.  Denies any diabetes.  States that he has been poking and prodding at it and created a hole which is why it started draining.  No trauma to the area.    HPI     Past Medical History:  Diagnosis Date  . PS (pyloric stenosis)    at 7 months of age- had surgery to correct    Patient Active Problem List   Diagnosis Date Noted  . Left elbow pain 11/12/2013  . Sore throat (viral) 06/05/2013  . Tobacco abuse 05/27/2013  . Physical exam, annual 02/21/2013  . High risk sexual behavior 02/21/2013  . Rotator cuff tear 02/23/2012  . Arm pain, musculoskeletal 02/22/2012  . Abscess of left axilla 02/05/2012  . Knee sprain and strain 10/06/2011  . Seasonal allergies 09/05/2011  . Sexually transmitted disease exposure 01/11/2011  . Septic arthritis of knee, right (HCC) 12/29/2010  . HIP PAIN 10/05/2009  . 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 KNEE SURGERY     joint infection from superficial skin wound- cleaned out by ortho  . SMALL INTESTINE SURGERY     unsure exactly what type of surgery from mother's description sounds like pyloric stenosis surgcial correction       Family History  Problem Relation Age of  Onset  . Cancer Maternal Grandmother   . Hypertension Maternal Grandmother     Social History   Tobacco Use  . Smoking status: Current Every Day Smoker    Packs/day: 0.50    Types: Cigarettes, Cigars  . Smokeless tobacco: Never Used  Substance Use Topics  . Alcohol use: No  . Drug use: No    Home Medications Prior to Admission medications   Medication Sig Start Date End Date Taking? Authorizing Provider  doxycycline (VIBRAMYCIN) 100 MG capsule Take 1 capsule (100 mg total) by mouth 2 (two) times daily. 04/09/20   Farrel Gordon, PA-C  ibuprofen (ADVIL,MOTRIN) 600 MG tablet Take 1 tablet (600 mg total) by mouth every 6 (six) hours as needed. Take with food Patient not taking: Reported on 07/26/2017 11/12/13   Lonia Skinner, MD    Allergies    Pollen extract  Review of Systems   Review of Systems  Constitutional: Negative for chills, diaphoresis, fatigue and fever.  HENT: Negative for congestion, sore throat and trouble swallowing.   Eyes: Negative for pain and visual disturbance.  Respiratory: Negative for cough, shortness of breath and wheezing.   Cardiovascular: Negative for chest pain, palpitations and leg swelling.  Gastrointestinal: Negative for abdominal distention, abdominal pain, diarrhea, nausea and vomiting.  Genitourinary: Negative for difficulty urinating.  Musculoskeletal: Negative for back pain, neck pain and neck stiffness.  Skin:  Positive for wound. Negative for pallor.  Neurological: Negative for dizziness, speech difficulty, weakness and headaches.  Psychiatric/Behavioral: Negative for confusion.    Physical Exam Updated Vital Signs BP (!) 141/75 (BP Location: Left Arm)   Pulse 77   Temp 99.7 F (37.6 C) (Oral)   Resp 15   Ht 5\' 10"  (1.778 m)   Wt 88.5 kg   SpO2 100%   BMI 27.98 kg/m   Physical Exam Constitutional:      General: He is not in acute distress.    Appearance: Normal appearance. He is not ill-appearing, toxic-appearing or  diaphoretic.  HENT:     Mouth/Throat:     Mouth: Mucous membranes are moist.     Pharynx: Oropharynx is clear.  Eyes:     General: No scleral icterus.    Extraocular Movements: Extraocular movements intact.     Pupils: Pupils are equal, round, and reactive to light.  Cardiovascular:     Rate and Rhythm: Normal rate and regular rhythm.     Pulses: Normal pulses.     Heart sounds: Normal heart sounds.  Pulmonary:     Effort: Pulmonary effort is normal. No respiratory distress.     Breath sounds: Normal breath sounds. No stridor. No wheezing, rhonchi or rales.  Chest:     Chest wall: No tenderness.  Abdominal:     General: Abdomen is flat. There is no distension.     Palpations: Abdomen is soft.     Tenderness: There is no abdominal tenderness. There is no guarding or rebound.  Musculoskeletal:        General: No swelling or tenderness. Normal range of motion.     Cervical back: Normal range of motion and neck supple. No rigidity.     Right lower leg: No edema.     Left lower leg: No edema.  Skin:    General: Skin is warm and dry.     Capillary Refill: Capillary refill takes less than 2 seconds.     Coloration: Skin is not pale.     Findings: Lesion present.     Comments: Patient with abscess on right upper extremity about 1 cm, is actively draining purulent drainage.  Some very mild surrounding cellulitis total of 4 cm with warmth, no streaking.  Normal range of motion with normal strength and sensation to entire upper extremity.  Radial pulse 2+, normal cap refill.  Neurological:     General: No focal deficit present.     Mental Status: He is alert and oriented to person, place, and time.  Psychiatric:        Mood and Affect: Mood normal.        Behavior: Behavior normal.     ED Results / Procedures / Treatments   Labs (all labs ordered are listed, but only abnormal results are displayed) Labs Reviewed - No data to display  EKG None  Radiology No results  found.  Procedures . Incision and Drainage  Date/Time: 04/09/2020 5:38 PM Performed by: 13/04/2020, PA-C Authorized by: Farrel Gordon, PA-C   Consent:    Consent obtained:  Verbal   Consent given by:  Patient   Risks discussed:  Bleeding, incomplete drainage, pain and damage to other organs   Alternatives discussed:  No treatment Universal protocol:    Procedure explained and questions answered to patient or proxy's satisfaction: yes     Relevant documents present and verified: yes     Test results available and properly labeled: yes  Imaging studies available: yes     Required blood products, implants, devices, and special equipment available: yes     Site/side marked: yes     Immediately prior to procedure a time out was called: yes     Patient identity confirmed:  Verbally with patient Location:    Type:  Abscess   Location:  Upper extremity   Upper extremity location:  Arm   Arm location:  R upper arm Pre-procedure details:    Skin preparation:  Betadine and Chloraprep Anesthesia (see MAR for exact dosages):    Anesthesia method:  Local infiltration   Local anesthetic:  Lidocaine 1% w/o epi Procedure type:    Complexity:  Complex Procedure details:    Incision types:  Single straight   Incision depth:  Subcutaneous   Scalpel blade:  11   Wound management:  Probed and deloculated   Drainage:  Serosanguinous   Drainage amount:  Moderate   Wound treatment:  Wound left open   Packing materials:  1/4 in gauze Post-procedure details:    Patient tolerance of procedure:  Tolerated well, no immediate complications   (including critical care time)  Medications Ordered in ED Medications  oxyCODONE-acetaminophen (PERCOCET/ROXICET) 5-325 MG per tablet 1 tablet (1 tablet Oral Given 04/09/20 1627)  lidocaine (PF) (XYLOCAINE) 1 % injection 30 mL (30 mLs Infiltration Given 04/09/20 1802)  ketorolac (TORADOL) injection 60 mg (60 mg Intramuscular Given 04/09/20 1809)     ED Course  I have reviewed the triage vital signs and the nursing notes.  Pertinent labs & imaging results that were available during my care of the patient were reviewed by me and considered in my medical decision making (see chart for details).    MDM Rules/Calculators/A&P                          Galan Ghee is a 24 y.o. male with pertinent past medical history of tobacco abuse that presents the emergency department today for bug bite.  Patient with small abscess with mild purulent drainage and some surrounding cellulitis, no concerns of deep space infection.  Vital signs stable, temperature 99.7.  Was able to drain this successfully, mild amount of serosanguineous drainage obtained.  Patient tolerated well, antibiotics given.  Was able to draw along cellulitis lines, did express to patient that he needs to come back if cellulitis extends beyond borders, he notices streaking or there are any new or worsening concerning symptoms.  Patient expressed understanding.  Patient be discharged.  Patient to come back in 2 days for wound check, patient appears reliable for this.  Pain has been controlled with Percocet.  Doubt need for further emergent work up at this time. I explained the diagnosis and have given explicit precautions to return to the ER including for any other new or worsening symptoms. The patient understands and accepts the medical plan as it's been dictated and I have answered their questions. Discharge instructions concerning home care and prescriptions have been given. The patient is STABLE and is discharged to home in good condition.  Final Clinical Impression(s) / ED Diagnoses Final diagnoses:  Abscess    Rx / DC Orders ED Discharge Orders         Ordered    doxycycline (VIBRAMYCIN) 100 MG capsule  2 times daily        04/09/20 1736           Farrel Gordon, PA-C 04/10/20 [phone]  Charlynne PanderYao, David Hsienta, MD 04/10/20 1452

## 2020-04-09 NOTE — Discharge Instructions (Addendum)
WOUND CARE Please return in 2  days to have your wound rechecked.   Keep area clean and dry for 24 hours. Do not remove bandage, if applied. Take your antibiotics   After 24 hours, remove bandage and wash wound gently with mild soap and warm water. Reapply a new bandage after cleaning wound, if directed.  Continue daily cleansing with soap and water until stitches/staples are removed.  Do not apply any ointments or creams to the wound while stitches/staples are in place, as this may cause delayed healing.  Notify the office if you experience any of the following signs of infection: Swelling, redness, pus drainage, streaking, fever >101.0 F  Notify the office if you experience excessive bleeding that does not stop after 15-20 minutes of constant, firm pressure.   Take Tylenol as directed on the bottle for pain, please take your antibiotics as directed.  Please speak to your pharmacist about any interactions or side effects to medications prescribed today.  If you have any new or worsening concerning symptoms he is back to the emergency department.

## 2020-05-09 ENCOUNTER — Encounter (HOSPITAL_COMMUNITY): Payer: Self-pay

## 2020-05-09 ENCOUNTER — Other Ambulatory Visit: Payer: Self-pay

## 2020-05-09 ENCOUNTER — Emergency Department (HOSPITAL_COMMUNITY)
Admission: EM | Admit: 2020-05-09 | Discharge: 2020-05-10 | Disposition: A | Discharge status: Home / Self Care | Payer: Self-pay | Attending: Emergency Medicine | Admitting: Emergency Medicine

## 2020-05-09 DIAGNOSIS — Z5321 Procedure and treatment not carried out due to patient leaving prior to being seen by health care provider: Secondary | ICD-10-CM | POA: Insufficient documentation

## 2020-05-09 DIAGNOSIS — Z113 Encounter for screening for infections with a predominantly sexual mode of transmission: Secondary | ICD-10-CM | POA: Insufficient documentation

## 2020-05-09 NOTE — ED Notes (Signed)
No answer when called for room 

## 2020-05-09 NOTE — ED Triage Notes (Signed)
Patient request STD check, no symptoms!! Alert and oriented

## 2021-04-18 ENCOUNTER — Other Ambulatory Visit: Payer: Self-pay

## 2021-04-18 ENCOUNTER — Ambulatory Visit (HOSPITAL_COMMUNITY)
Admission: EM | Admit: 2021-04-18 | Discharge: 2021-04-18 | Disposition: A | Discharge status: Home / Self Care | Payer: Self-pay | Attending: Urgent Care | Admitting: Urgent Care

## 2021-04-18 ENCOUNTER — Encounter (HOSPITAL_COMMUNITY): Payer: Self-pay | Admitting: Emergency Medicine

## 2021-04-18 DIAGNOSIS — Z7251 High risk heterosexual behavior: Secondary | ICD-10-CM | POA: Insufficient documentation

## 2021-04-18 LAB — HIV ANTIBODY (ROUTINE TESTING W REFLEX): HIV Screen 4th Generation wRfx: NONREACTIVE

## 2021-04-18 NOTE — ED Triage Notes (Signed)
Wants STD testing. Denies exposure or any symptoms.

## 2021-04-18 NOTE — ED Provider Notes (Signed)
  [facility]   MRN: [id number] DOB: 1996/02/10  Subjective:   Sanjeev Main is a 25 y.o. male presenting for STI testing.  Has multiple male partners, does not use condoms for protection. Denies dysuria, hematuria, urinary frequency, penile discharge, penile swelling, testicular pain, testicular swelling, anal pain, groin pain.  Denies taking chronic medication.  Allergies  Allergen Reactions   Pollen Extract Swelling and Rash    Past Medical History:  Diagnosis Date   PS (pyloric stenosis)    at 42 months of age- had surgery to correct     Past Surgical History:  Procedure Laterality Date   KNEE SURGERY     joint infection from superficial skin wound- cleaned out by ortho   SMALL INTESTINE SURGERY     unsure exactly what type of surgery from mother's description sounds like pyloric stenosis surgcial correction    Family History  Problem Relation Age of Onset   Cancer Maternal Grandmother    Hypertension Maternal Grandmother     Social History   Tobacco Use   Smoking status: Every Day    Packs/day: 0.50    Types: Cigarettes, Cigars   Smokeless tobacco: Never  Substance Use Topics   Alcohol use: No   Drug use: No    ROS   Objective:   Vitals: BP (!) 147/87 (BP Location: Left Arm)   Pulse 68   Temp 98.5 F (36.9 C) (Oral)   Resp 16   SpO2 98%   Physical Exam Constitutional:      General: He is not in acute distress.    Appearance: Normal appearance. He is well-developed and normal weight. He is not ill-appearing, toxic-appearing or diaphoretic.  HENT:     Head: Normocephalic and atraumatic.     Right Ear: External ear normal.     Left Ear: External ear normal.     Nose: Nose normal.     Mouth/Throat:     Pharynx: Oropharynx is clear.  Eyes:     General: No scleral icterus.       Right eye: No discharge.        Left eye: No discharge.     Extraocular Movements: Extraocular movements intact.     Pupils: Pupils are  equal, round, and reactive to light.  Cardiovascular:     Rate and Rhythm: Normal rate.  Pulmonary:     Effort: Pulmonary effort is normal.  Musculoskeletal:     Cervical back: Normal range of motion.  Neurological:     Mental Status: He is alert and oriented to person, place, and time.  Psychiatric:        Mood and Affect: Mood normal.        Behavior: Behavior normal.        Thought Content: Thought content normal.        Judgment: Judgment normal.    Assessment and Plan :   PDMP not reviewed this encounter.  1. Unprotected sex    STI testing pending. Will treat as appropriate based off lab results.    Wallis Bamberg, PA-C 04/18/21 1191

## 2021-04-19 ENCOUNTER — Telehealth (HOSPITAL_COMMUNITY): Payer: Self-pay | Admitting: Emergency Medicine

## 2021-04-19 LAB — RPR: RPR Ser Ql: NONREACTIVE

## 2021-04-19 LAB — CYTOLOGY, (ORAL, ANAL, URETHRAL) ANCILLARY ONLY
Chlamydia: NEGATIVE
Comment: NEGATIVE
Comment: NEGATIVE
Comment: NORMAL
Neisseria Gonorrhea: NEGATIVE
Trichomonas: POSITIVE — AB

## 2021-04-19 MED ORDER — METRONIDAZOLE 500 MG PO TABS: 2000.0000 mg | ORAL_TABLET | Freq: Once | ORAL | 0 refills | Status: AC

## 2021-08-21 ENCOUNTER — Encounter (HOSPITAL_COMMUNITY): Payer: Self-pay | Admitting: *Deleted

## 2021-08-21 ENCOUNTER — Ambulatory Visit (HOSPITAL_COMMUNITY)
Admission: EM | Admit: 2021-08-21 | Discharge: 2021-08-21 | Disposition: A | Discharge status: Home / Self Care | Payer: Medicaid Other | Attending: Nurse Practitioner | Admitting: Nurse Practitioner

## 2021-08-21 ENCOUNTER — Other Ambulatory Visit: Payer: Self-pay

## 2021-08-21 DIAGNOSIS — Z113 Encounter for screening for infections with a predominantly sexual mode of transmission: Secondary | ICD-10-CM | POA: Insufficient documentation

## 2021-08-21 DIAGNOSIS — Z202 Contact with and (suspected) exposure to infections with a predominantly sexual mode of transmission: Secondary | ICD-10-CM | POA: Insufficient documentation

## 2021-08-21 NOTE — ED Triage Notes (Signed)
Pt reports a partner reported testing positive for STD. ?

## 2021-08-21 NOTE — ED Provider Notes (Signed)
?[facility] ? ? ? ?CSN: [id number] ?Arrival date & time: 08/21/21  1122 ? ? ?  ? ?History   ?Chief Complaint ?Chief Complaint  ?Patient presents with  ? Exposure to STD  ? ? ?HPI ?Jose Gray is a 26 y.o. male.  ? ?Patient presents today for STD screening.  Reports he was sexually active about 2 weeks ago and did not use protection.  He reports his most recent partner told him to come be tested.  Patient currently denies any penile discharge, burning with urination, fevers, nausea, vomiting, swelling in his groin, penile sores or lesions.  He declines HIV and syphilis testing today. ? ? ?Past Medical History:  ?Diagnosis Date  ? PS (pyloric stenosis)   ? at 57 months of age- had surgery to correct  ? ? ?Patient Active Problem List  ? Diagnosis Date Noted  ? Left elbow pain 11/12/2013  ? Sore throat (viral) 06/05/2013  ? Tobacco abuse 05/27/2013  ? Physical exam, annual 02/21/2013  ? High risk sexual behavior 02/21/2013  ? Rotator cuff tear 02/23/2012  ? Arm pain, musculoskeletal 02/22/2012  ? Abscess of left axilla 02/05/2012  ? Knee sprain and strain 10/06/2011  ? Seasonal allergies 09/05/2011  ? Sexually transmitted disease exposure 01/11/2011  ? Septic arthritis of knee, right (HCC) 12/29/2010  ? HIP PAIN 10/05/2009  ? 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 ? ?Past Surgical History:  ?Procedure Laterality Date  ? KNEE SURGERY    ? joint infection from superficial skin wound- cleaned out by ortho  ? SMALL INTESTINE SURGERY    ? unsure exactly what type of surgery from mother's description sounds like pyloric stenosis surgcial correction  ? ? ? ? ? ?Home Medications   ? ?Prior to Admission medications   ?Medication Sig Start Date End Date Taking? Authorizing Provider  ?doxycycline (VIBRAMYCIN) 100 MG capsule Take 1 capsule (100 mg total) by mouth 2 (two) times daily. 04/09/20   Farrel Gordon, PA-C  ?ibuprofen (ADVIL,MOTRIN) 600 MG tablet Take 1 tablet (600 mg total) by mouth every 6 (six)  hours as needed. Take with food ?Patient not taking: Reported on 07/26/2017 11/12/13   Lonia Skinner, MD  ? ? ?Family History ?Family History  ?Problem Relation Age of Onset  ? Cancer Maternal Grandmother   ? Hypertension Maternal Grandmother   ? ? ?Social History ?Social History  ? ?Tobacco Use  ? Smoking status: Every Day  ?  Packs/day: 0.50  ?  Types: Cigarettes, Cigars  ? Smokeless tobacco: Never  ?Substance Use Topics  ? Alcohol use: No  ? Drug use: No  ? ? ? ?Allergies   ?Pollen extract ? ? ?Review of Systems ?Review of Systems ?Per HPI ? ?Physical Exam ?Triage Vital Signs ?ED Triage Vitals  ?Enc Vitals Group  ?   BP 08/21/21 1254 (!) 144/89  ?   Pulse Rate 08/21/21 1254 61  ?   Resp 08/21/21 1254 18  ?   Temp 08/21/21 1254 98 ?F (36.7 ?C)  ?   Temp src --   ?   SpO2 08/21/21 1254 98 %  ?   Weight --   ?   Height --   ?   Head Circumference --   ?   Peak Flow --   ?   Pain Score 08/21/21 1251 0  ?   Pain Loc --   ?   Pain Edu? --   ?   Excl. in GC? --   ? ?  No data found. ? ?Updated Vital Signs ?BP (!) 144/89   Pulse 61   Temp 98 ?F (36.7 ?C)   Resp 18   SpO2 98%  ? ?Visual Acuity ?Right Eye Distance:   ?Left Eye Distance:   ?Bilateral Distance:   ? ?Right Eye Near:   ?Left Eye Near:    ?Bilateral Near:    ? ?Physical Exam ?Vitals and nursing note reviewed.  ?Constitutional:   ?   General: He is not in acute distress. ?   Appearance: Normal appearance. He is not toxic-appearing.  ?Pulmonary:  ?   Effort: Pulmonary effort is normal. No respiratory distress.  ?Skin: ?   General: Skin is warm and dry.  ?   Capillary Refill: Capillary refill takes less than 2 seconds.  ?   Coloration: Skin is not jaundiced or pale.  ?Neurological:  ?   Mental Status: He is alert and oriented to person, place, and time.  ?   Motor: No weakness.  ?   Gait: Gait normal.  ?Psychiatric:     ?   Behavior: Behavior is cooperative.  ? ? ? ?UC Treatments / Results  ?Labs ?(all labs ordered are listed, but only abnormal results are  displayed) ?Labs Reviewed  ?CYTOLOGY, (ORAL, ANAL, URETHRAL) ANCILLARY ONLY  ? ? ?EKG ? ? ?Radiology ?No results found. ? ?Procedures ?Procedures (including critical care time) ? ?Medications Ordered in UC ?Medications - No data to display ? ?Initial Impression / Assessment and Plan / UC Course  ?I have reviewed the triage vital signs and the nursing notes. ? ?Pertinent labs & imaging results that were available during my care of the patient were reviewed by me and considered in my medical decision making (see chart for details). ? ?  ?We will screen for gonorrhea, chlamydia, trichomonas with swab swab today.  Will notify patient with positive results and treat as indicated.  Encouraged condom use with every sexual encounter. ? ?Final Clinical Impressions(s) / UC Diagnoses  ? ?Final diagnoses:  ?Possible exposure to STD  ?Screening examination for STD (sexually transmitted disease)  ? ? ? ?Discharge Instructions   ? ?  ?- We will let you know with any positive results and we will send in treatment to your pharmacy ?-Please wear condoms with every sexual encounter ? ? ? ? ?ED Prescriptions   ?None ?  ? ?PDMP not reviewed this encounter. ?  ?Valentino Nose, NP ?08/21/21 1327 ? ?

## 2021-08-21 NOTE — Discharge Instructions (Addendum)
-   We will let you know with any positive results and we will send in treatment to your pharmacy ?-Please wear condoms with every sexual encounter ?

## 2021-08-23 LAB — CYTOLOGY, (ORAL, ANAL, URETHRAL) ANCILLARY ONLY
Chlamydia: NEGATIVE
Comment: NEGATIVE
Comment: NEGATIVE
Comment: NORMAL
Neisseria Gonorrhea: NEGATIVE
Trichomonas: NEGATIVE

## 2021-08-31 ENCOUNTER — Other Ambulatory Visit: Payer: Self-pay

## 2021-08-31 ENCOUNTER — Emergency Department (HOSPITAL_COMMUNITY): Payer: Self-pay

## 2021-08-31 ENCOUNTER — Emergency Department (HOSPITAL_COMMUNITY)
Admission: EM | Admit: 2021-08-31 | Discharge: 2021-08-31 | Disposition: A | Discharge status: Home / Self Care | Payer: Self-pay | Attending: Emergency Medicine | Admitting: Emergency Medicine

## 2021-08-31 DIAGNOSIS — R0602 Shortness of breath: Secondary | ICD-10-CM | POA: Insufficient documentation

## 2021-08-31 DIAGNOSIS — J45909 Unspecified asthma, uncomplicated: Secondary | ICD-10-CM | POA: Insufficient documentation

## 2021-08-31 DIAGNOSIS — F17210 Nicotine dependence, cigarettes, uncomplicated: Secondary | ICD-10-CM | POA: Insufficient documentation

## 2021-08-31 LAB — CBC WITH DIFFERENTIAL/PLATELET
Abs Immature Granulocytes: 0.03 10*3/uL (ref 0.00–0.07)
Basophils Absolute: 0 10*3/uL (ref 0.0–0.1)
Basophils Relative: 0 %
Eosinophils Absolute: 0.3 10*3/uL (ref 0.0–0.5)
Eosinophils Relative: 4 %
HCT: 46.7 % (ref 39.0–52.0)
Hemoglobin: 15.5 g/dL (ref 13.0–17.0)
Immature Granulocytes: 0 %
Lymphocytes Relative: 38 %
Lymphs Abs: 3.1 10*3/uL (ref 0.7–4.0)
MCH: 30.3 pg (ref 26.0–34.0)
MCHC: 33.2 g/dL (ref 30.0–36.0)
MCV: 91.2 fL (ref 80.0–100.0)
Monocytes Absolute: 0.8 10*3/uL (ref 0.1–1.0)
Monocytes Relative: 10 %
Neutro Abs: 3.9 10*3/uL (ref 1.7–7.7)
Neutrophils Relative %: 48 %
Platelets: 267 10*3/uL (ref 150–400)
RBC: 5.12 MIL/uL (ref 4.22–5.81)
RDW: 14.5 % (ref 11.5–15.5)
WBC: 8.1 10*3/uL (ref 4.0–10.5)
nRBC: 0 % (ref 0.0–0.2)

## 2021-08-31 LAB — COMPREHENSIVE METABOLIC PANEL
ALT: 56 U/L — ABNORMAL HIGH (ref 0–44)
AST: 40 U/L (ref 15–41)
Albumin: 4.1 g/dL (ref 3.5–5.0)
Alkaline Phosphatase: 104 U/L (ref 38–126)
Anion gap: 7 (ref 5–15)
BUN: 14 mg/dL (ref 6–20)
CO2: 26 mmol/L (ref 22–32)
Calcium: 9.2 mg/dL (ref 8.9–10.3)
Chloride: 107 mmol/L (ref 98–111)
Creatinine, Ser: 1.35 mg/dL — ABNORMAL HIGH (ref 0.61–1.24)
GFR, Estimated: >60 mL/min (ref >60)
Glucose, Bld: 108 mg/dL — ABNORMAL HIGH (ref 70–99)
Potassium: 4.1 mmol/L (ref 3.5–5.1)
Sodium: 140 mmol/L (ref 135–145)
Total Bilirubin: 0.9 mg/dL (ref 0.3–1.2)
Total Protein: 6.3 g/dL — ABNORMAL LOW (ref 6.5–8.1)

## 2021-08-31 LAB — TROPONIN I (HIGH SENSITIVITY)
Troponin I (High Sensitivity): 4 ng/L (ref <18)
Troponin I (High Sensitivity): 4 ng/L (ref <18)

## 2021-08-31 MED ORDER — IPRATROPIUM-ALBUTEROL 0.5-2.5 (3) MG/3ML IN SOLN
3.0000 mL | Freq: Once | RESPIRATORY_TRACT | Status: AC
  Administered 2021-08-31: 3 mL via RESPIRATORY_TRACT
  Filled 2021-08-31: qty 3

## 2021-08-31 MED ORDER — METHYLPREDNISOLONE SODIUM SUCC 125 MG IJ SOLR
60.0000 mg | Freq: Once | INTRAMUSCULAR | Status: AC
  Administered 2021-08-31: 60 mg via INTRAMUSCULAR
  Filled 2021-08-31: qty 2

## 2021-08-31 NOTE — ED Provider Triage Note (Signed)
Emergency Medicine Provider Triage Evaluation Note ? ?Jose Gray , a 26 y.o. male  was evaluated in triage.  Pt complains of shortness of breath.  States that his symptoms have been waxing and waning for the past 2 months.  Symptoms are particularly worse at night.  States that sometimes eats larger meals right before going to bed.  Reports a history of reflux and states that the other night after eating pizza he vomited in bed due to the severity of his symptoms.  Denies any current nausea.  Denies chest pain.  States that he smokes marijuana, vapes, and intermittently takes Roxicodone.  Last used Roxicodone about 3 days ago and notes that when taking this his shortness of breath sometimes worsens.  He exercises regularly and denies any shortness of breath when exercising. ? ?Physical Exam  ?BP (!) 159/81 (BP Location: Right Arm)   Pulse 73   Temp 97.6 ?F (36.4 ?C) (Oral)   Resp 18   SpO2 98%  ?Gen:   Awake, no distress   ?Resp:  Normal effort  ?MSK:   Moves extremities without difficulty  ?Other:   ? ?Medical Decision Making  ?Medically screening exam initiated at 3:22 AM.  Appropriate orders placed.  Jose Gray was informed that the remainder of the evaluation will be completed by another provider, this initial triage assessment does not replace that evaluation, and the importance of remaining in the ED until their evaluation is complete. ?  ?Rayna Sexton, PA-C ?08/31/21 [id number] ? ?

## 2021-08-31 NOTE — ED Triage Notes (Signed)
Pt c/o increasing shob x 1 month. Pt reports using oxy for pain after the gym and has stopped three days ago.  ?

## 2021-08-31 NOTE — Discharge Instructions (Addendum)
Follow up with PCP for further evaluation of symptoms. Worrisome signs and symptoms were discussed and the patient expressed understanding for return if noticed.  ?

## 2021-08-31 NOTE — ED Provider Notes (Signed)
?[facility] EMERGENCY DEPARTMENT ?Provider Note ? ? ?CSN: [id number] ?Arrival date & time: 08/31/21  0257 ? ?  ? ?History ? ?Chief Complaint  ?Patient presents with  ? Shortness of Breath  ? ? ?Jose Gray is a 26 y.o. male. ? ?26 year old male presents to ED with one month history of shortness of breath. Shortness of breath was worse this morning which caused him to come in. Symptoms exacerbated by exercise and noticed at times like waking up, after eating. Described as "forgetting how to breathe" and "hard to catch breath." Episodes do not last longer than one minute. Denies chest pain, cough, sick exposure, sinus pressure, rhinorrhea, ear pain, n/v/d, abdominal pain, hx of asthma.  ? ?PMHx is significant for GERD. Family hx of mother COPD and maternal grandmother COPD, htn, cancer ? ?Surgical history of right knee and small bowel ? ?Patient reports smoking 3 "blacks" cigarettes a day and marijuana. Denies alcohol use and IV drug use but consumes opioids. ? ? ?Shortness of Breath ?Associated symptoms: no abdominal pain, no chest pain, no cough, no fever, no sore throat and no vomiting   ?Shortness of Breath ?Associated symptoms: fever   ?Associated symptoms: no abdominal pain, no chest pain, no cough, no sore throat and no vomiting   ?Shortness of Breath ?Associated symptoms: no abdominal pain, no chest pain, no cough, no fever, no sore throat and no vomiting   ?Shortness of Breath ?Associated symptoms: no abdominal pain, no chest pain, no cough, no fever and no sore throat   ?Shortness of Breath ?Associated symptoms: no chest pain, no cough, no fever and no sore throat   ?Shortness of Breath ?Associated symptoms: no cough, no fever and no sore throat   ?Shortness of Breath ?Associated symptoms: wheezing   ?Associated symptoms: no cough, no fever and no sore throat   ?Shortness of Breath ?Associated symptoms: no cough, no fever and no sore throat   ?Shortness of Breath ?Associated symptoms:  no fever and no sore throat   ?Shortness of Breath ?Associated symptoms: no fever   ? ?  ? ?Home Medications ?Prior to Admission medications   ?Not on File  ?   ? ?Allergies    ?Pollen extract   ? ?Review of Systems   ?Review of Systems  ?Constitutional:  Negative for chills, fatigue and fever.  ?HENT:  Negative for congestion, postnasal drip, rhinorrhea, sinus pressure, sinus pain and sore throat.   ?Respiratory:  Positive for shortness of breath. Negative for cough and chest tightness.   ?Cardiovascular:  Negative for chest pain, palpitations and leg swelling.  ?Gastrointestinal:  Negative for abdominal distention, abdominal pain, nausea and vomiting.  ?Musculoskeletal:  Negative for myalgias.  ?Neurological:  Negative for dizziness, seizures, syncope and light-headedness.  ? ?Physical Exam ?Updated Vital Signs ?BP 120/73 (BP Location: Left Arm)   Pulse 62   Temp 98.2 ?F (36.8 ?C) (Oral)   Resp 18   SpO2 97%  ?Physical Exam ?Vitals and nursing note reviewed.  ?Constitutional:   ?   Appearance: Normal appearance. He is well-developed and normal weight.  ?HENT:  ?   Head: Normocephalic and atraumatic.  ?   Mouth/Throat:  ?   Mouth: Mucous membranes are moist.  ?   Pharynx: Oropharynx is clear.  ?Neck:  ?   Trachea: Trachea and phonation normal.  ?Cardiovascular:  ?   Rate and Rhythm: Normal rate and regular rhythm.  ?   Pulses: Normal pulses.  ?  Heart sounds: Normal heart sounds.  ?Pulmonary:  ?   Effort: Pulmonary effort is normal.  ?   Breath sounds: Examination of the right-upper field reveals wheezing. Examination of the left-upper field reveals wheezing. Examination of the right-middle field reveals wheezing. Examination of the left-middle field reveals wheezing. Examination of the right-lower field reveals wheezing. Examination of the left-lower field reveals wheezing. Wheezing present. No decreased breath sounds, rhonchi or rales.  ?Abdominal:  ?   General: Bowel sounds are normal.  ?   Palpations:  Abdomen is soft.  ?Musculoskeletal:  ?   Cervical back: Normal range of motion and neck supple.  ?Lymphadenopathy:  ?   Cervical: No cervical adenopathy.  ?Skin: ?   General: Skin is warm and dry.  ?Neurological:  ?   General: No focal deficit present.  ?   Mental Status: He is alert and oriented to person, place, and time.  ?Psychiatric:     ?   Mood and Affect: Mood normal.     ?   Behavior: Behavior normal. Behavior is cooperative.  ? ? ?ED Results / Procedures / Treatments   ?Labs ?(all labs ordered are listed, but only abnormal results are displayed) ?Labs Reviewed  ?COMPREHENSIVE METABOLIC PANEL - Abnormal; Notable for the following components:  ?    Result Value  ? Glucose, Bld 108 (*)   ? Creatinine, Ser 1.35 (*)   ? Total Protein 6.3 (*)   ? ALT 56 (*)   ? All other components within normal limits  ?CBC WITH DIFFERENTIAL/PLATELET  ?TROPONIN I (HIGH SENSITIVITY)  ?TROPONIN I (HIGH SENSITIVITY)  ? ? ?EKG ?EKG Interpretation ? ?Date/Time:  Wednesday August 31 2021 03:07:33 EDT ?Ventricular Rate:  74 ?PR Interval:  140 ?QRS Duration: 80 ?QT Interval:  348 ?QTC Calculation: 386 ?R Axis:   95 ?Text Interpretation: Normal sinus rhythm with sinus arrhythmia Rightward axis ST-t wave abnormality Abnormal ECG Confirmed by Gerhard Munch [phone]) on 08/31/2021 9:00:33 AM ? ?Radiology ?DG Chest 2 View ? ?Result Date: 08/31/2021 ?CLINICAL DATA:  Shortness of breath EXAM: CHEST - 2 VIEW COMPARISON:  None. FINDINGS: The heart size and mediastinal contours are within normal limits. Both lungs are clear. The visualized skeletal structures are unremarkable. IMPRESSION: No active cardiopulmonary disease. Electronically Signed   By: Deatra Robinson M.D.   On: 08/31/2021 03:48   ? ?Procedures ?Procedures  ? ? ?Medications Ordered in ED ?Medications - No data to display ? ?ED Course/ Medical Decision Making/ A&P ?  ?                        ?Medical Decision Making ? ?Shortness of breath ?Vitals signs within normal limits ?Lab results  insignificant for concerning pathology ?CXR showed no active pulmonary disease ?Moderate bilateral wheezing noted on physical exam ?PMHx of cigarette and marijuana smoking ?Tx with Duoneb and IM solumedrol with noticeable improvement  ?Concern for asthma vs anxiety/stress induced vs combined discussed with patient ?F/u with PCP for further evaluation and testing. ? ? ? ? ? ? ? ?Final Clinical Impression(s) / ED Diagnoses ?Final diagnoses:  ?None  ? ? ?Rx / DC Orders ?ED Discharge Orders   ? ? None  ? ?  ? ? ?  ?Peter Garter, [place] ?08/31/21 1219 ? ?  ?Gerhard Munch, MD ?08/31/21 1236 ? ?  ?Gerhard Munch, MD ?08/31/21 1239 ? ?

## 2023-08-24 ENCOUNTER — Encounter (HOSPITAL_BASED_OUTPATIENT_CLINIC_OR_DEPARTMENT_OTHER): Payer: Self-pay | Admitting: Emergency Medicine

## 2023-08-24 ENCOUNTER — Emergency Department (HOSPITAL_BASED_OUTPATIENT_CLINIC_OR_DEPARTMENT_OTHER)
Admission: EM | Admit: 2023-08-24 | Discharge: 2023-08-24 | Discharge status: Against Medical Advice | Attending: Emergency Medicine | Admitting: Emergency Medicine

## 2023-08-24 ENCOUNTER — Other Ambulatory Visit: Payer: Self-pay

## 2023-08-24 DIAGNOSIS — R5383 Other fatigue: Secondary | ICD-10-CM | POA: Diagnosis present

## 2023-08-24 DIAGNOSIS — Z5321 Procedure and treatment not carried out due to patient leaving prior to being seen by health care provider: Secondary | ICD-10-CM | POA: Insufficient documentation

## 2023-08-24 NOTE — ED Notes (Signed)
 Pt eloped.

## 2023-08-24 NOTE — ED Notes (Signed)
 Pt not in hallway bed/ secretary reported seeing him leave/ provider made aware

## 2023-08-24 NOTE — ED Triage Notes (Addendum)
 Pt reports "feeling fatigue and a body shock out of nowhere", reports "smoking weed" prior to event, pt states he "just feels weird"

## 2023-08-26 ENCOUNTER — Ambulatory Visit
Admission: EM | Admit: 2023-08-26 | Discharge: 2023-08-26 | Disposition: A | Discharge status: Home / Self Care | Attending: Family Medicine | Admitting: Family Medicine

## 2023-08-26 ENCOUNTER — Ambulatory Visit (HOSPITAL_BASED_OUTPATIENT_CLINIC_OR_DEPARTMENT_OTHER)
Admission: RE | Admit: 2023-08-26 | Discharge: 2023-08-26 | Disposition: A | Discharge status: Home / Self Care | Source: Ambulatory Visit | Attending: Urgent Care | Admitting: Urgent Care

## 2023-08-26 ENCOUNTER — Telehealth: Payer: Self-pay

## 2023-08-26 DIAGNOSIS — S93401A Sprain of unspecified ligament of right ankle, initial encounter: Secondary | ICD-10-CM | POA: Insufficient documentation

## 2023-08-26 DIAGNOSIS — M25571 Pain in right ankle and joints of right foot: Secondary | ICD-10-CM | POA: Diagnosis present

## 2023-08-26 MED ORDER — NAPROXEN 500 MG PO TABS: 500.0000 mg | ORAL_TABLET | Freq: Two times a day (BID) | ORAL | 0 refills | Status: AC

## 2023-08-26 MED ORDER — NAPROXEN 500 MG PO TABS: 500.0000 mg | ORAL_TABLET | Freq: Two times a day (BID) | ORAL | 0 refills | Status: DC

## 2023-08-26 MED ORDER — IBUPROFEN 800 MG PO TABS
800.0000 mg | ORAL_TABLET | Freq: Once | ORAL | Status: AC
  Administered 2023-08-26: 800 mg via ORAL

## 2023-08-26 NOTE — Discharge Instructions (Signed)
I have placed orders to have an x-ray done at the med center in [place].  Please check in through the emergency room but do not register to be seen as a patient.  They will contact the radiology department and a staff member will take you to go get the x-ray done.  ?

## 2023-08-26 NOTE — ED Triage Notes (Signed)
 Patient reports he rolled his right ankle yesterday playing football. Patient reports r ankle and foot pain. Pain 09/10

## 2023-08-26 NOTE — ED Provider Notes (Signed)
 [facility]  Note:  This document was prepared using Conservation officer, historic buildings and may include unintentional dictation errors.  MRN: [id number] DOB: Sep 04, 1995  Subjective:   Jose Gray is a 28 y.o. male presenting for 1 day history of persistent right ankle and foot pain with swelling.  This was from an injury that he sustained while playing football.  Initially, he was able to bear weight on his right foot and ankle but overnight has gotten dramatically worse.    No current facility-administered medications for this encounter.  Current Outpatient Medications:    Prenatal Vit-Fe Fumarate-FA (MULTIVITAMIN-PRENATAL) 27-0.8 MG TABS tablet, Take 1 tablet by mouth daily at 12 noon., Disp: , Rfl:    Allergies  Allergen Reactions   Pollen Extract Swelling    Past Medical History:  Diagnosis Date   PS (pyloric stenosis)    at 25 months of age- had surgery to correct     Past Surgical History:  Procedure Laterality Date   KNEE SURGERY     joint infection from superficial skin wound- cleaned out by ortho   SMALL INTESTINE SURGERY     unsure exactly what type of surgery from mother's description sounds like pyloric stenosis surgcial correction    Family History  Problem Relation Age of Onset   Cancer Maternal Grandmother    Hypertension Maternal Grandmother     Social History   Tobacco Use   Smoking status: Every Day    Current packs/day: 0.50    Types: Cigarettes, Cigars   Smokeless tobacco: Never  Substance Use Topics   Alcohol use: No   Drug use: Yes    Types: Marijuana    ROS   Objective:   Vitals: BP 137/71 (BP Location: Left Arm)   Pulse 97   Temp 98.3 F (36.8 C) (Oral)   Resp 18   SpO2 97%   Physical Exam Constitutional:      General: He is not in acute distress.    Appearance: Normal appearance. He is well-developed and normal weight. He is not ill-appearing, toxic-appearing or diaphoretic.  HENT:      Head: Normocephalic and atraumatic.     Right Ear: External ear normal.     Left Ear: External ear normal.     Nose: Nose normal.     Mouth/Throat:     Pharynx: Oropharynx is clear.  Eyes:     General: No scleral icterus.       Right eye: No discharge.        Left eye: No discharge.     Extraocular Movements: Extraocular movements intact.  Cardiovascular:     Rate and Rhythm: Normal rate.  Pulmonary:     Effort: Pulmonary effort is normal.  Musculoskeletal:     Cervical back: Normal range of motion.     Right ankle: Swelling present. No deformity, ecchymosis or lacerations. Tenderness present over the lateral malleolus, medial malleolus, ATF ligament and AITF ligament. No CF ligament, posterior TF ligament, base of 5th metatarsal or proximal fibula tenderness. Decreased range of motion.     Right Achilles Tendon: No tenderness or defects. Thompson's test negative.  Neurological:     Mental Status: He is alert and oriented to person, place, and time.  Psychiatric:        Mood and Affect: Mood normal.        Behavior: Behavior normal.        Thought Content: Thought content normal.  Judgment: Judgment normal.     DG Ankle Complete Right Result Date: 08/26/2023 CLINICAL DATA:  Right ankle pain. Twisted ankle while playing football. EXAM: RIGHT ANKLE - COMPLETE 3+ VIEW COMPARISON:  None Available. FINDINGS: There are no signs of acute fracture or dislocation. No significant arthropathy identified. Lateral soft tissue swelling noted. IMPRESSION: Lateral soft tissue swelling. No fracture or dislocation. Electronically Signed   By: Signa Kell M.D.   On: 08/26/2023 12:11   Right ankle wrapped using 4" Ace wrap in figure-8 method.  Provided with crutches in clinic.  Assessment and Plan :   PDMP not reviewed this encounter.  1. Sprain of right ankle, unspecified ligament, initial encounter   2. Acute right ankle pain    Will manage for ankle sprain with rice method, NSAID.  Counseled patient on potential for adverse effects with medications prescribed/recommended today, ER and return-to-clinic precautions discussed, patient verbalized understanding.    Wallis Bamberg, [place] 08/26/23 3244

## 2024-03-30 ENCOUNTER — Ambulatory Visit
Admission: EM | Admit: 2024-03-30 | Discharge: 2024-03-30 | Disposition: A | Discharge status: Home / Self Care | Attending: Family Medicine | Admitting: Family Medicine

## 2024-03-30 ENCOUNTER — Telehealth: Payer: Self-pay

## 2024-03-30 DIAGNOSIS — L731 Pseudofolliculitis barbae: Secondary | ICD-10-CM

## 2024-03-30 MED ORDER — SULFAMETHOXAZOLE-TRIMETHOPRIM 800-160 MG PO TABS: 1.0000 | ORAL_TABLET | Freq: Two times a day (BID) | ORAL | 0 refills | Status: DC

## 2024-03-30 MED ORDER — SULFAMETHOXAZOLE-TRIMETHOPRIM 800-160 MG PO TABS: 1.0000 | ORAL_TABLET | Freq: Two times a day (BID) | ORAL | 0 refills | Status: AC

## 2024-03-30 NOTE — ED Provider Notes (Signed)
 [facility]    CSN: [id number] Arrival date & time: 03/30/24  1003      History   Chief Complaint No chief complaint on file.   HPI Jose Gray is a 28 y.o. male presents for bump on head.  Patient reports 1 to 2 days of a tender bump on the right side of his head.  Denies any known injury or inciting event.  No head injury.  Does state he has been wearing a hat frequently.  States area is tender if anything touches it.  No fevers, chills, drainage.  No history of MRSA.  No OTC symptoms have been used since onset.  No other concerns at this time.  HPI  Past Medical History:  Diagnosis Date   PS (pyloric stenosis)    at 47 months of age- had surgery to correct    Patient Active Problem List   Diagnosis Date Noted   Left elbow pain 11/12/2013   Sore throat (viral) 06/05/2013   Tobacco abuse 05/27/2013   Physical exam, annual 02/21/2013   High risk sexual behavior 02/21/2013   Rotator cuff tear 02/23/2012   Arm pain, musculoskeletal 02/22/2012   Abscess of left axilla 02/05/2012   Knee sprain and strain 10/06/2011   Seasonal allergies 09/05/2011   Sexually transmitted disease exposure 01/11/2011   Septic arthritis of knee, right (HCC) 12/29/2010   HIP PAIN 10/05/2009   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 Authorizing Provider  naproxen  (NAPROSYN ) 500 MG tablet Take 1 tablet (500 mg total) by mouth 2 (two) times daily with a meal. 08/26/23   Christopher Savannah, PA-C  Prenatal Vit-Fe Fumarate-FA (MULTIVITAMIN-PRENATAL) 27-0.8 MG TABS tablet Take 1 tablet by mouth daily at 12  noon.    [provider]  sulfamethoxazole-trimethoprim (BACTRIM DS) 800-160 MG tablet Take 1 tablet by mouth 2 (two) times daily for 7 days. 03/30/24 04/06/24 Yes Loreda Myla SAUNDERS, NP    Family History Family History  Problem Relation Age of Onset   Cancer Maternal Grandmother    Hypertension Maternal Grandmother     Social History Social History   Tobacco Use   Smoking status: Every Day    Current packs/day: 0.50    Types: Cigarettes, Cigars   Smokeless tobacco: Never  Vaping Use   Vaping status: Never Used  Substance Use Topics   Alcohol use: No   Drug use: Yes    Types: Marijuana     Allergies   Pollen extract   Review of Systems Review of Systems  Skin:        Bump on head     Physical Exam Triage Vital Signs ED Triage Vitals  Encounter Vitals Group     BP 03/30/24 1042 (!) 147/84     Girls Systolic BP Percentile --      Girls Diastolic BP Percentile --      Boys Systolic BP Percentile --      Boys Diastolic BP Percentile --      Pulse Rate 03/30/24 1042 73     Resp 03/30/24 1042  18     Temp 03/30/24 1042 98.7 F (37.1 C)     Temp Source 03/30/24 1042 Oral     SpO2 03/30/24 1042 98 %     Weight --      Height --      Head Circumference --      Peak Flow --      Pain Score 03/30/24 1044 8     Pain Loc --      Pain Education --      Exclude from Growth Chart --    No data found.  Updated Vital Signs BP (!) 147/84 (BP Location: Left Arm)   Pulse 73   Temp 98.7 F (37.1 C) (Oral)   Resp 18   SpO2 98%   Visual Acuity Right Eye Distance:   Left Eye Distance:   Bilateral Distance:    Right Eye Near:   Left Eye Near:    Bilateral Near:     Physical Exam Vitals and nursing note reviewed.  Constitutional:      General: He is not in acute distress.    Appearance: Normal appearance. He is not ill-appearing.  HENT:     Head: Normocephalic and atraumatic.      Comments: There is a tender nonerythematous papule to the right posterior  aspect of the scalp.  No induration or fluctuance.  No drainage.  No swelling. Eyes:     Pupils: Pupils are equal, round, and reactive to light.  Cardiovascular:     Rate and Rhythm: Normal rate.  Pulmonary:     Effort: Pulmonary effort is normal.  Skin:    General: Skin is warm and dry.  Neurological:     General: No focal deficit present.     Mental Status: He is alert and oriented to person, place, and time.  Psychiatric:        Mood and Affect: Mood normal.        Behavior: Behavior normal.      UC Treatments / Results  Labs (all labs ordered are listed, but only abnormal results are displayed) Labs Reviewed - No data to display  EKG   Radiology No results found.  Procedures Procedures (including critical care time)  Medications Ordered in UC Medications - No data to display  Initial Impression / Assessment and Plan / UC Course  I have reviewed the triage vital signs and the nursing notes.  Pertinent labs & imaging results that were available during my care of the patient were reviewed by me and considered in my medical decision making (see chart for details).     Reviewed exam and symptoms with patient.  No red flags.  Discussed likely ingrown hair, will do Bactrim and advise warm compresses.  OTC analgesics as needed.  PCP follow-up if symptoms do not improve.  ER precautions reviewed Final Clinical Impressions(s) / UC Diagnoses   Final diagnoses:  Ingrown hair     Discharge Instructions      Start Bactrim twice daily for 7 days.  Warm compresses to the area as needed.  You may take Tylenol  or ibuprofen  over-the-counter as needed for pain.  Please follow-up with your PCP if your symptoms do not improve.  Please go to the ER for any worsening symptoms.  Hope you feel better soon!    ED Prescriptions     Medication Sig Dispense Auth. Provider   sulfamethoxazole-trimethoprim (BACTRIM DS) 800-160 MG tablet Take 1 tablet by mouth 2 (two) times daily for 7  days. 14 tablet Beyonce Sawatzky, Jodi R, NP      PDMP not reviewed this encounter.   Loreda Myla SAUNDERS, NP 03/30/24 1104

## 2024-03-30 NOTE — Discharge Instructions (Addendum)
 Start Bactrim twice daily for 7 days.  Warm compresses to the area as needed.  You may take Tylenol  or ibuprofen  over-the-counter as needed for pain.  Please follow-up with your PCP if your symptoms do not improve.  Please go to the ER for any worsening symptoms.  Hope you feel better soon!

## 2024-03-30 NOTE — ED Triage Notes (Signed)
 Pt reports he has a painful bump when he touch it in the head x 1 1/2-2 days. States as I know is there I can't take it out of my mind.
